# Patient Record
Sex: Male | Born: 1975 | Race: Black or African American | Hispanic: No | Smoking: Never smoker
Health system: Southern US, Community
[De-identification: ages and names within clinical notes are randomized; demographics above are authoritative.]

## PROBLEM LIST (undated history)

## (undated) DIAGNOSIS — J45909 Unspecified asthma, uncomplicated: Secondary | ICD-10-CM

## (undated) HISTORY — PX: ELBOW SURGERY: SHX618

---

## 2004-12-06 ENCOUNTER — Emergency Department: Payer: Self-pay | Admitting: Emergency Medicine

## 2007-01-21 ENCOUNTER — Emergency Department: Payer: Self-pay | Admitting: Unknown Physician Specialty

## 2007-11-15 ENCOUNTER — Emergency Department: Payer: Self-pay | Admitting: Emergency Medicine

## 2009-12-13 ENCOUNTER — Emergency Department: Payer: Self-pay | Admitting: Emergency Medicine

## 2012-06-03 ENCOUNTER — Emergency Department: Payer: Self-pay | Admitting: Emergency Medicine

## 2015-08-14 ENCOUNTER — Emergency Department
Admission: EM | Admit: 2015-08-14 | Discharge: 2015-08-14 | Disposition: A | Discharge status: Home / Self Care | Payer: 59 | Attending: Emergency Medicine | Admitting: Emergency Medicine

## 2015-08-14 ENCOUNTER — Encounter: Payer: Self-pay | Admitting: Emergency Medicine

## 2015-08-14 DIAGNOSIS — R300 Dysuria: Secondary | ICD-10-CM | POA: Diagnosis present

## 2015-08-14 LAB — URINALYSIS COMPLETE WITH MICROSCOPIC (ARMC ONLY)
BACTERIA UA: NONE SEEN
BILIRUBIN URINE: NEGATIVE
GLUCOSE, UA: NEGATIVE mg/dL
Hgb urine dipstick: NEGATIVE
KETONES UR: NEGATIVE mg/dL
LEUKOCYTES UA: NEGATIVE
NITRITE: NEGATIVE
Protein, ur: NEGATIVE mg/dL
SPECIFIC GRAVITY, URINE: 1.025 (ref 1.005–1.030)
Squamous Epithelial / LPF: NONE SEEN
pH: 5 (ref 5.0–8.0)

## 2015-08-14 LAB — CHLAMYDIA/NGC RT PCR (ARMC ONLY)
CHLAMYDIA TR: NOT DETECTED
N gonorrhoeae: NOT DETECTED

## 2015-08-14 MED ORDER — PHENAZOPYRIDINE HCL 200 MG PO TABS: 200.0000 mg | ORAL_TABLET | Freq: Three times a day (TID) | ORAL | Status: DC | PRN

## 2015-08-14 NOTE — ED Notes (Signed)
States he developed penile burning /itching since last week

## 2015-08-14 NOTE — ED Notes (Signed)
States he had unprotected sex last week  Then developed some itching   States the itching is constant

## 2015-08-14 NOTE — ED Provider Notes (Signed)
[facility] Emergency Department Provider Note   ____________________________________________  Time seen: Approximately 11:14 AM  I have reviewed the triage vital signs and the nursing notes.   HISTORY  Chief Complaint Exposure to STD    HPI Stephen Mercado is a 40 y.o. male male complaining of urethral burning 1 week. Further questions patient describes most itching sensation when he urinated. Any vaginal discharge. Patient does have multiple sexual partners and unprotected sexual intercourse. Patient denies any fever or chills with this complaint patient denies any penial lesions. Patient denies any swelling in the groin area. History reviewed. No pertinent past medical history.  There are no active problems to display for this patient.   History reviewed. No pertinent past surgical history.  Current Outpatient Rx  Name  Route  Sig  Dispense  Refill  . phenazopyridine (PYRIDIUM) 200 MG tablet   Oral   Take 1 tablet (200 mg total) by mouth 3 (three) times daily as needed for pain.   6 tablet   0     Allergies Review of patient's allergies indicates no known allergies.  No family history on file.  Social History Social History  Substance Use Topics  . Smoking status: Never Smoker   . Smokeless tobacco: None  . Alcohol Use: No    Review of Systems Constitutional: No fever/chills Eyes: No visual changes. ENT: No sore throat. Cardiovascular: Denies chest pain. Respiratory: Denies shortness of breath. Gastrointestinal: No abdominal pain.  No nausea, no vomiting.  No diarrhea.  No constipation. Genitourinary: Positive for dysuria. Musculoskeletal: Negative for back pain. Skin: Negative for rash. Neurological: Negative for headaches, focal weakness or numbness.    ____________________________________________   PHYSICAL EXAM:  VITAL SIGNS: ED Triage Vitals  Enc Vitals Group     BP --      Pulse --      Resp --      Temp --    Temp src --      SpO2 --      Weight --      Height --      Head Cir --      Peak Flow --      Pain Score --      Pain Loc --      Pain Edu? --      Excl. in [place]? --     Constitutional: Alert and oriented. Well appearing and in no acute distress. Eyes: Conjunctivae are normal. PERRL. EOMI. Head: Atraumatic. Nose: No congestion/rhinnorhea. Mouth/Throat: Mucous membranes are moist.  Oropharynx non-erythematous. Neck: No stridor. No cervical spine tenderness to palpation. Hematological/Lymphatic/Immunilogical: No cervical lymphadenopathy. Cardiovascular: Normal rate, regular rhythm. Grossly normal heart sounds.  Good peripheral circulation. Respiratory: Normal respiratory effort.  No retractions. Lungs CTAB. Gastrointestinal: Soft and nontender. No distention. No abdominal bruits. No CVA tenderness. Genitourinary: No lesions. Nodes express discharge. No adenopathy. **}Musculoskeletal: No lower extremity tenderness nor edema.  No joint effusions. Neurologic:  Normal speech and language. No gross focal neurologic deficits are appreciated. No gait instability. Skin:  Skin is warm, dry and intact. No rash noted. Psychiatric: Mood and affect are normal. Speech and behavior are normal.  ____________________________________________   LABS (all labs ordered are listed, but only abnormal results are displayed)  Labs Reviewed  URINALYSIS COMPLETEWITH MICROSCOPIC ([place] ONLY) - Abnormal; Notable for the following:    Color, Urine YELLOW (*)    APPearance CLEAR (*)    All other components within normal limits  CHLAMYDIA/NGC RT  PCR ([facility] ONLY)   ____________________________________________  EKG   ____________________________________________  RADIOLOGY   ____________________________________________   PROCEDURES  Procedure(s) performed: None  Critical Care performed: No  ____________________________________________   INITIAL IMPRESSION / ASSESSMENT AND PLAN / ED  COURSE  Pertinent labs & imaging results that were available during my care of the patient were reviewed by me and considered in my medical decision making (see chart for details).  Dysuria without objective findings. Patient advised Chlamydia and GC is pending. Patient reports  has to leave to pick up children. Patient advised he'll be contacted telephonically if Chlamydia and GC are positive. ____________________________________________   FINAL CLINICAL IMPRESSION(S) / ED DIAGNOSES  Final diagnoses:  Dysuria      NEW MEDICATIONS STARTED DURING THIS VISIT:  New Prescriptions   PHENAZOPYRIDINE (PYRIDIUM) 200 MG TABLET    Take 1 tablet (200 mg total) by mouth 3 (three) times daily as needed for pain.     Note:  This document was prepared using Dragon voice recognition software and may include unintentional dictation errors.     Sable Feil, PA-C 08/14/15 1307  Lisa Roca, MD 08/14/15 1350

## 2015-08-14 NOTE — Discharge Instructions (Signed)

## 2016-05-18 ENCOUNTER — Emergency Department
Admission: EM | Admit: 2016-05-18 | Discharge: 2016-05-18 | Disposition: A | Discharge status: Home / Self Care | Payer: 59 | Attending: Emergency Medicine | Admitting: Emergency Medicine

## 2016-05-18 ENCOUNTER — Encounter: Payer: Self-pay | Admitting: Medical Oncology

## 2016-05-18 DIAGNOSIS — J45909 Unspecified asthma, uncomplicated: Secondary | ICD-10-CM | POA: Diagnosis not present

## 2016-05-18 DIAGNOSIS — J111 Influenza due to unidentified influenza virus with other respiratory manifestations: Secondary | ICD-10-CM | POA: Insufficient documentation

## 2016-05-18 DIAGNOSIS — R509 Fever, unspecified: Secondary | ICD-10-CM | POA: Diagnosis present

## 2016-05-18 HISTORY — DX: Unspecified asthma, uncomplicated: J45.909

## 2016-05-18 MED ORDER — ACETAMINOPHEN 500 MG PO TABS
ORAL_TABLET | ORAL | Status: AC
  Filled 2016-05-18: qty 2

## 2016-05-18 MED ORDER — FLUTICASONE PROPIONATE 50 MCG/ACT NA SUSP: 2.0000 | Freq: Every day | NASAL | 0 refills | Status: DC

## 2016-05-18 MED ORDER — ACETAMINOPHEN 500 MG PO TABS
1000.0000 mg | ORAL_TABLET | Freq: Once | ORAL | Status: AC
  Administered 2016-05-18: 1000 mg via ORAL

## 2016-05-18 MED ORDER — BENZONATATE 100 MG PO CAPS: 100.0000 mg | ORAL_CAPSULE | Freq: Three times a day (TID) | ORAL | 0 refills | Status: DC | PRN

## 2016-05-18 NOTE — ED Notes (Signed)
Notified PA Berniece Salines about pt's temperature order to give Tylenol

## 2016-05-18 NOTE — ED Triage Notes (Signed)
Pt reports he woke up this am with chills, fever, and body aches.

## 2016-05-18 NOTE — ED Notes (Signed)
Pt reports generalized malaise for the past 2 days reports taking OTC meds to help with symptoms pt denies any n/v/d/ pt talks in complete sentences no respiratory distress noted

## 2016-05-18 NOTE — ED Notes (Signed)
Pt verbalizes understanding of discharge instructions.

## 2016-05-18 NOTE — ED Provider Notes (Signed)
[facility] Emergency Department Provider Note ____________________________________________  Time seen: 1813  I have reviewed the triage vital signs and the nursing notes.  HISTORY  Chief Complaint  Fever; Chills; and Generalized Body Aches  HPI Stephen Mercado is a 41 y.o. male presents to the ED for flu-like symptoms for the last 3 days. He reports subjective fevers, sinus pressure, and bodyaches. He did not receive the seasonal flu vaccine. He has dosed DayQuil and Alka-Seltzer tabs for symptom relief. He denies nausea, vomiting or diarrhea.   Past Medical History:  Diagnosis Date  . Asthma     There are no active problems to display for this patient.  History reviewed. No pertinent surgical history.  Prior to Admission medications   Medication Sig Start Date End Date Taking? Authorizing Provider  benzonatate (TESSALON PERLES) 100 MG capsule Take 1 capsule (100 mg total) by mouth 3 (three) times daily as needed for cough (Take 1-2 per dose). 05/18/16   Aletta Edmunds V Bacon Taquila Leys, PA-C  fluticasone (FLONASE) 50 MCG/ACT nasal spray Place 2 sprays into both nostrils daily. 05/18/16   Aragon Scarantino V Bacon Djibril Glogowski, PA-C  phenazopyridine (PYRIDIUM) 200 MG tablet Take 1 tablet (200 mg total) by mouth 3 (three) times daily as needed for pain. 08/14/15   Sable Feil, PA-C    Allergies Patient has no known allergies.  No family history on file.  Social History Social History  Substance Use Topics  . Smoking status: Never Smoker  . Smokeless tobacco: Not on file  . Alcohol use No    Review of Systems  Constitutional: Positive for fever. Eyes: Negative for visual changes. ENT: Negative for sore throat. Reports sinus congestions Cardiovascular: Negative for chest pain. Respiratory: Negative for shortness of breath. Gastrointestinal: Negative for abdominal pain, vomiting and diarrhea. Musculoskeletal: Negative for back pain. Reports generalized bodyaches Skin:  Negative for rash. Neurological: Negative for headaches, focal weakness or numbness. ____________________________________________  PHYSICAL EXAM:  VITAL SIGNS: ED Triage Vitals  Enc Vitals Group     BP 05/18/16 1608 (!) 156/82     Pulse Rate 05/18/16 1608 (!) 102     Resp 05/18/16 1608 18     Temp 05/18/16 1608 100 F (37.8 C)     Temp Source 05/18/16 1608 Oral     SpO2 05/18/16 1608 96 %     Weight 05/18/16 1607 152 lb (68.9 kg)     Height 05/18/16 1607 5\' 8"  (1.727 m)     Head Circumference --      Peak Flow --      Pain Score 05/18/16 1608 8     Pain Loc --      Pain Edu? --      Excl. in [place]? --     Constitutional: Alert and oriented. Well appearing and in no distress. Head: Normocephalic and atraumatic. Eyes: Conjunctivae are normal. PERRL. Normal extraocular movements Ears: Canals clear. TMs intact bilaterally. Nose: No congestion/rhinorrhea/epistaxis. Mouth/Throat: Mucous membranes are moist. Uvula is midline. Tonsils are flat.  Neck: Supple. No thyromegaly. Hematological/Lymphatic/Immunological: No cervical lymphadenopathy. Cardiovascular: Normal rate, regular rhythm. Normal distal pulses. Respiratory: Normal respiratory effort. No wheezes/rales/rhonchi. Gastrointestinal: Soft and nontender. No distention. Musculoskeletal: Nontender with normal range of motion in all extremities.  ____________________________________________  PROCEDURES  Tylenol 1000 mg PO ____________________________________________  INITIAL IMPRESSION / ASSESSMENT AND PLAN / ED COURSE  Patient with influenza-like illness. He will be discharged with prescription for [facility] and Flonase. He will dose OTC Delsym, Tylenol, and pseudoephedrine.  A work note is provided for 3 days.  ____________________________________________  FINAL CLINICAL IMPRESSION(S) / ED DIAGNOSES  Final diagnoses:  Influenza      Melvenia Needles, PA-C 05/18/16 1850    Hinda Kehr, MD 05/18/16  2036

## 2016-05-18 NOTE — Discharge Instructions (Signed)
Your symptoms are consistent with the flu (influenza). Continue to dose your OTC meds. Consider starting Delsym for cough, Ibuprofen for fevers, and pseudoephedrine for sinus pressure. Take the prescription meds as directed. Follow-up with your provider or [facility] for continued symptoms.

## 2016-06-02 ENCOUNTER — Encounter (HOSPITAL_COMMUNITY): Payer: Self-pay

## 2016-06-02 DIAGNOSIS — R360 Urethral discharge without blood: Secondary | ICD-10-CM | POA: Diagnosis present

## 2016-06-02 DIAGNOSIS — Z79899 Other long term (current) drug therapy: Secondary | ICD-10-CM | POA: Insufficient documentation

## 2016-06-02 DIAGNOSIS — N342 Other urethritis: Secondary | ICD-10-CM | POA: Diagnosis not present

## 2016-06-02 DIAGNOSIS — J45909 Unspecified asthma, uncomplicated: Secondary | ICD-10-CM | POA: Insufficient documentation

## 2016-06-02 NOTE — ED Triage Notes (Signed)
Pt complaining of painful urination and discharge x 2 weeks. Pt states believe is chlamydia. Pt states hx of same. Pt states recent new sexual partner.

## 2016-06-03 ENCOUNTER — Emergency Department (HOSPITAL_COMMUNITY)
Admission: EM | Admit: 2016-06-03 | Discharge: 2016-06-03 | Disposition: A | Discharge status: Home / Self Care | Payer: 59 | Attending: Emergency Medicine | Admitting: Emergency Medicine

## 2016-06-03 DIAGNOSIS — R369 Urethral discharge, unspecified: Secondary | ICD-10-CM

## 2016-06-03 DIAGNOSIS — N342 Other urethritis: Secondary | ICD-10-CM

## 2016-06-03 LAB — GC/CHLAMYDIA PROBE AMP (~~LOC~~) NOT AT ARMC
Chlamydia: POSITIVE — AB
Neisseria Gonorrhea: NEGATIVE

## 2016-06-03 MED ORDER — AZITHROMYCIN 250 MG PO TABS
1000.0000 mg | ORAL_TABLET | Freq: Once | ORAL | Status: AC
  Administered 2016-06-03: 1000 mg via ORAL
  Filled 2016-06-03: qty 4

## 2016-06-03 MED ORDER — LIDOCAINE HCL (PF) 1 % IJ SOLN
INTRAMUSCULAR | Status: AC
  Administered 2016-06-03: 5 mL
  Filled 2016-06-03: qty 5

## 2016-06-03 MED ORDER — CEFTRIAXONE SODIUM 1 G IJ SOLR
1.0000 g | Freq: Once | INTRAMUSCULAR | Status: AC
  Administered 2016-06-03: 1 g via INTRAMUSCULAR
  Filled 2016-06-03: qty 10

## 2016-06-03 NOTE — ED Provider Notes (Signed)
[facility] DEPT Provider Note   CSN: [id number] Arrival date & time: 06/02/16  2351     History   Chief Complaint Chief Complaint  Patient presents with  . Penile Discharge    HPI Kavonte Doney is a 41 y.o. male.  Patient presents emergency department with chief complaint of penile discharge. He reports onset yesterday. He endorses new sexual encounters. He reports some burning when he denies. He denies any fevers chills. Denies any testicle pain. Denies any other associated symptoms.   The history is provided by the patient. No language interpreter was used.    Past Medical History:  Diagnosis Date  . Asthma     There are no active problems to display for this patient.   History reviewed. No pertinent surgical history.     Home Medications    Prior to Admission medications   Medication Sig Start Date End Date Taking? Authorizing Provider  benzonatate (TESSALON PERLES) 100 MG capsule Take 1 capsule (100 mg total) by mouth 3 (three) times daily as needed for cough (Take 1-2 per dose). 05/18/16   Jenise V Bacon Menshew, PA-C  fluticasone (FLONASE) 50 MCG/ACT nasal spray Place 2 sprays into both nostrils daily. 05/18/16   Jenise V Bacon Menshew, PA-C  phenazopyridine (PYRIDIUM) 200 MG tablet Take 1 tablet (200 mg total) by mouth 3 (three) times daily as needed for pain. 08/14/15   Sable Feil, PA-C    Family History History reviewed. No pertinent family history.  Social History Social History  Substance Use Topics  . Smoking status: Never Smoker  . Smokeless tobacco: Never Used  . Alcohol use No     Allergies   Patient has no known allergies.   Review of Systems Review of Systems  Genitourinary: Positive for discharge and dysuria. Negative for testicular pain.     Physical Exam Updated Vital Signs BP 143/99 (BP Location: Left Arm)   Pulse 74   Temp 98.3 F (36.8 C) (Oral)   Resp 18   SpO2 98%   Physical Exam  Constitutional: He is oriented  to person, place, and time. No distress.  HENT:  Head: Normocephalic and atraumatic.  Eyes: Conjunctivae and EOM are normal. Pupils are equal, round, and reactive to light.  Neck: No tracheal deviation present.  Cardiovascular: Normal rate.   Pulmonary/Chest: Effort normal. No respiratory distress.  Abdominal: Soft.  Genitourinary:  Genitourinary Comments: Normal circumcised male, no masses or lesions, white milky discharge from penis  Musculoskeletal: Normal range of motion.  Neurological: He is alert and oriented to person, place, and time.  Skin: Skin is warm and dry. He is not diaphoretic.  Psychiatric: Judgment normal.  Nursing note and vitals reviewed.    ED Treatments / Results  Labs (all labs ordered are listed, but only abnormal results are displayed) Labs Reviewed  GC/CHLAMYDIA PROBE AMP ([place]) NOT AT [facility]    EKG  EKG Interpretation None       Radiology No results found.  Procedures Procedures (including critical care time)  Medications Ordered in ED Medications  cefTRIAXone (ROCEPHIN) injection 1 g (1 g Intramuscular Given 06/03/16 0053)  azithromycin (ZITHROMAX) tablet 1,000 mg (1,000 mg Oral Given 06/03/16 0053)  lidocaine (PF) (XYLOCAINE) 1 % injection (5 mLs  Given 06/03/16 0053)     Initial Impression / Assessment and Plan / ED Course  I have reviewed the triage vital signs and the nursing notes.  Pertinent labs & imaging results that were available during my care of  the patient were reviewed by me and considered in my medical decision making (see chart for details).     Patient with penile discharge. Will test for gonorrhea and chlamydia. Will treat with Rocephin and azithromycin. Primary care follow-up. Safe sex recommendations given.   Final Clinical Impressions(s) / ED Diagnoses   Final diagnoses:  Penile discharge  Urethritis    New Prescriptions New Prescriptions   No medications on file     Montine Circle, PA-C 06/03/16  [place], MD 06/03/16 [phone]

## 2016-07-17 ENCOUNTER — Emergency Department (HOSPITAL_COMMUNITY)
Admission: EM | Admit: 2016-07-17 | Discharge: 2016-07-17 | Disposition: A | Discharge status: Home / Self Care | Payer: 59 | Attending: Emergency Medicine | Admitting: Emergency Medicine

## 2016-07-17 DIAGNOSIS — J45909 Unspecified asthma, uncomplicated: Secondary | ICD-10-CM | POA: Insufficient documentation

## 2016-07-17 DIAGNOSIS — R369 Urethral discharge, unspecified: Secondary | ICD-10-CM | POA: Diagnosis present

## 2016-07-17 DIAGNOSIS — N3000 Acute cystitis without hematuria: Secondary | ICD-10-CM | POA: Insufficient documentation

## 2016-07-17 LAB — URINALYSIS, ROUTINE W REFLEX MICROSCOPIC
BILIRUBIN URINE: NEGATIVE
Glucose, UA: NEGATIVE mg/dL
Ketones, ur: NEGATIVE mg/dL
Nitrite: NEGATIVE
PROTEIN: NEGATIVE mg/dL
Specific Gravity, Urine: 1.028 (ref 1.005–1.030)
pH: 5 (ref 5.0–8.0)

## 2016-07-17 LAB — GC/CHLAMYDIA PROBE AMP (~~LOC~~) NOT AT ARMC
CHLAMYDIA, DNA PROBE: POSITIVE — AB
Neisseria Gonorrhea: NEGATIVE

## 2016-07-17 MED ORDER — STERILE WATER FOR INJECTION IJ SOLN
INTRAMUSCULAR | Status: AC
  Filled 2016-07-17: qty 10

## 2016-07-17 MED ORDER — AZITHROMYCIN 250 MG PO TABS
1000.0000 mg | ORAL_TABLET | Freq: Once | ORAL | Status: AC
  Administered 2016-07-17: 1000 mg via ORAL
  Filled 2016-07-17: qty 4

## 2016-07-17 MED ORDER — CEPHALEXIN 500 MG PO CAPS: 500.0000 mg | ORAL_CAPSULE | Freq: Two times a day (BID) | ORAL | 0 refills | Status: DC

## 2016-07-17 MED ORDER — CEFTRIAXONE SODIUM 250 MG IJ SOLR
250.0000 mg | Freq: Once | INTRAMUSCULAR | Status: AC
  Administered 2016-07-17: 250 mg via INTRAMUSCULAR
  Filled 2016-07-17: qty 250

## 2016-07-17 NOTE — ED Provider Notes (Signed)
TIME SEEN: 5:13 AM  CHIEF COMPLAINT: Penile discharge  HPI: Patient is a 41 year old male with previous history of Chlamydia who presents emergency department with 2 weeks of intermittent episodes of clear penile discharge. Was diagnosed with chlamydia or a 26 2018 and was given azithromycin 1000 mg. States his girlfriend was also treated and they both waited one week before any sexual activity again. He states that his penile discharge previously was yellow and resolved but now has come back and is clear and intermittent. No discharge currently. No dysuria, hematuria. No testicular pain or swelling. Denies fevers, chills, nausea, vomiting, diarrhea. No abdominal pain or flank pain. Did not have any blood work testing him for HIV, syphilis or hepatitis. He does have a primary care provider.  ROS: See HPI Constitutional: no fever  Eyes: no drainage  ENT: no runny nose   Cardiovascular:  no chest pain  Resp: no SOB  GI: no vomiting GU: no dysuria Integumentary: no rash  Allergy: no hives  Musculoskeletal: no leg swelling  Neurological: no slurred speech ROS otherwise negative  PAST MEDICAL HISTORY/PAST SURGICAL HISTORY:  Past Medical History:  Diagnosis Date  . Asthma     MEDICATIONS:  Prior to Admission medications   Medication Sig Start Date End Date Taking? Authorizing Provider  benzonatate (TESSALON PERLES) 100 MG capsule Take 1 capsule (100 mg total) by mouth 3 (three) times daily as needed for cough (Take 1-2 per dose). 05/18/16   Jenise V Bacon Menshew, PA-C  fluticasone (FLONASE) 50 MCG/ACT nasal spray Place 2 sprays into both nostrils daily. 05/18/16   Jenise V Bacon Menshew, PA-C  phenazopyridine (PYRIDIUM) 200 MG tablet Take 1 tablet (200 mg total) by mouth 3 (three) times daily as needed for pain. 08/14/15   Sable Feil, PA-C    ALLERGIES:  No Known Allergies  SOCIAL HISTORY:  Social History  Substance Use Topics  . Smoking status: Never Smoker  . Smokeless tobacco:  Never Used  . Alcohol use No    FAMILY HISTORY: No family history on file.  EXAM: BP 132/66 (BP Location: Left Arm)   Pulse 74   Temp 97.8 F (36.6 C) (Oral)   Resp 18   SpO2 100%  CONSTITUTIONAL: Alert and oriented and responds appropriately to questions. Well-appearing; well-nourished HEAD: Normocephalic EYES: Conjunctivae clear, pupils appear equal, EOMI ENT: normal nose; moist mucous membranes NECK: Supple, no meningismus, no nuchal rigidity, no LAD  CARD: RRR; S1 and S2 appreciated; no murmurs, no clicks, no rubs, no gallops RESP: Normal chest excursion without splinting or tachypnea; breath sounds clear and equal bilaterally; no wheezes, no rhonchi, no rales, no hypoxia or respiratory distress, speaking full sentences ABD/GI: Normal bowel sounds; non-distended; soft, non-tender, no rebound, no guarding, no peritoneal signs, no hepatosplenomegaly GU:  Declines  BACK:  The back appears normal and is non-tender to palpation, there is no CVA tenderness EXT: Normal ROM in all joints; non-tender to palpation; no edema; normal capillary refill; no cyanosis, no calf tenderness or swelling    SKIN: Normal color for age and race; warm; no rash NEURO: Moves all extremities equally PSYCH: The patient's mood and manner are appropriate. Grooming and personal hygiene are appropriate.  MEDICAL DECISION MAKING: Patient here with intermittent pain on discharge. Urine shows too numerous to count white blood cells but rare bacteria. We'll send urine culture. We'll also add on urine gonorrhea and chlamydia testing. Have offered HIV, syphilis and hepatitis testing today but patient declines stating he will follow-up  as an outpatient. Have advised his girlfriend who is also at bedside that she will need to be tested for STDs again as well. We'll give him a dose of ceftriaxone 250 mg IM and azithromycin 1000 mg by mouth here in the emergency department. We'll discharge with Keflex 500 mg twice a day for  the next week. Discussed return precautions. Patient and girlfriend are patent comfortable with this plan.  At this time, I do not feel there is any life-threatening condition present. I have reviewed and discussed all results (EKG, imaging, lab, urine as appropriate) and exam findings with patient/family. I have reviewed nursing notes and appropriate previous records.  I feel the patient is safe to be discharged home without further emergent workup and can continue workup as an outpatient as needed. Discussed usual and customary return precautions. Patient/family verbalize understanding and are comfortable with this plan.  Outpatient follow-up has been provided if needed. All questions have been answered.   I personally performed the services described in this documentation, which was scribed in my presence. The recorded information has been reviewed and is accurate.      [place], DO 07/17/16 [phone]

## 2016-07-17 NOTE — Discharge Instructions (Signed)
You have been treated again for gonorrhea and Chlamydia. I recommended you partner be tested again as well. I recommended she both avoid any sexual intercourse including vaginal, oral and anal sex for the next week after you have both received either negative result or have been treated for any positive results. I recommend outpatient follow-up for HIV, syphilis and hepatitis testing. Please take your antibiotics until complete. These antibiotics are covering you for possible urinary tract infection.

## 2016-07-17 NOTE — ED Triage Notes (Signed)
Pt states he was dx with STD over a week ago; pt states he continues to have clear to yellowish discharge; pt states he does not know if it is STD or UTI; pt denies pain; pt a&o x 4 on arrival; Pt denies urinary sx

## 2016-07-18 LAB — URINE CULTURE: Culture: NO GROWTH

## 2016-09-16 ENCOUNTER — Encounter: Payer: Self-pay | Admitting: Family Medicine

## 2016-09-16 ENCOUNTER — Ambulatory Visit (INDEPENDENT_AMBULATORY_CARE_PROVIDER_SITE_OTHER): Payer: 59 | Admitting: Family Medicine

## 2016-09-16 VITALS — BP 131/82 | HR 97 | Temp 98.4°F | Ht 68.2 in | Wt 164.3 lb

## 2016-09-16 DIAGNOSIS — Z0000 Encounter for general adult medical examination without abnormal findings: Secondary | ICD-10-CM | POA: Diagnosis not present

## 2016-09-16 DIAGNOSIS — J45909 Unspecified asthma, uncomplicated: Secondary | ICD-10-CM | POA: Insufficient documentation

## 2016-09-16 DIAGNOSIS — M654 Radial styloid tenosynovitis [de Quervain]: Secondary | ICD-10-CM | POA: Insufficient documentation

## 2016-09-16 DIAGNOSIS — Z113 Encounter for screening for infections with a predominantly sexual mode of transmission: Secondary | ICD-10-CM | POA: Diagnosis not present

## 2016-09-16 DIAGNOSIS — J4520 Mild intermittent asthma, uncomplicated: Secondary | ICD-10-CM

## 2016-09-16 LAB — UA/M W/RFLX CULTURE, ROUTINE
Bilirubin, UA: NEGATIVE
Glucose, UA: NEGATIVE
KETONES UA: NEGATIVE
LEUKOCYTES UA: NEGATIVE
Nitrite, UA: NEGATIVE
SPEC GRAV UA: 1.02 (ref 1.005–1.030)
Urobilinogen, Ur: 1 mg/dL (ref 0.2–1.0)
pH, UA: 7 (ref 5.0–7.5)

## 2016-09-16 LAB — MICROSCOPIC EXAMINATION: Bacteria, UA: NONE SEEN

## 2016-09-16 MED ORDER — ALBUTEROL SULFATE HFA 108 (90 BASE) MCG/ACT IN AERS: 2.0000 | INHALATION_SPRAY | Freq: Four times a day (QID) | RESPIRATORY_TRACT | 2 refills | Status: DC | PRN

## 2016-09-16 MED ORDER — NAPROXEN 500 MG PO TABS: 500.0000 mg | ORAL_TABLET | Freq: Two times a day (BID) | ORAL | 0 refills | Status: DC

## 2016-09-16 NOTE — Progress Notes (Signed)
BP 131/82 (BP Location: Left Arm, Patient Position: Sitting, Cuff Size: Large)   Pulse 97   Temp 98.4 F (36.9 C)   Ht 5' 8.2" (1.732 m)   Wt 164 lb 4.8 oz (74.5 kg)   SpO2 97%   BMI 24.84 kg/m    Subjective:    Patient ID: Stephen Mercado, male    DOB: 1975-06-11, 41 y.o.   MRN: [id number]  HPI: Stephen Mercado is a 41 y.o. male who presents today to establish care.   Chief Complaint  Patient presents with  . Establish Care  . Asthma  . Wrist Pain    Right, painful to open hand    HAND PAIN- lifted something up about 2 months ago and has been having pain in his R wrist since then. Duration: 2 months  Involved hand: right Mechanism of injury: picking something up Location: radial Onset: sudden Severity: moderate to severe Quality: tender and occasionally sharp Frequency: intermittent Radiation: yes- into his forearm Aggravating factors: opening his hand Alleviating factors: leaving it alone Treatments attempted: ice Relief with NSAIDs?: No NSAIDs Taken Weakness: no Numbness: no Redness: no Swelling:yes Bruising: no Fevers: no  ASTHMA- since childhood. Has been on inhalers in the past.  Asthma status: uncontrolled Satisfied with current treatment?: no Albuterol/rescue inhaler frequency: doesn't have one Dyspnea frequency: rarely Wheezing frequency: none Cough frequency: none Nocturnal symptom frequency: rarely  Limitation of activity: no Current upper respiratory symptoms: no Triggers: dogs, allergies Asthma meds in past: has never been on medicine every day Aerochamber/spacer use: no Visits to ER or Urgent Care in past year: no Pneumovax: Not up to Date Influenza: Not up to Date  Active Ambulatory Problems    Diagnosis Date Noted  . Asthma   . De Quervain's tenosynovitis, right 09/16/2016   Resolved Ambulatory Problems    Diagnosis Date Noted  . No Resolved Ambulatory Problems   Past Medical History:  Diagnosis Date  . Asthma    Past Surgical  History:  Procedure Laterality Date  . ELBOW SURGERY Left    Screws   Outpatient Encounter Prescriptions as of 09/16/2016  Medication Sig  . albuterol (PROVENTIL HFA;VENTOLIN HFA) 108 (90 Base) MCG/ACT inhaler Inhale 2 puffs into the lungs every 6 (six) hours as needed for wheezing or shortness of breath.  . naproxen (NAPROSYN) 500 MG tablet Take 1 tablet (500 mg total) by mouth 2 (two) times daily with a meal.  . [DISCONTINUED] benzonatate (TESSALON PERLES) 100 MG capsule Take 1 capsule (100 mg total) by mouth 3 (three) times daily as needed for cough (Take 1-2 per dose).  . [DISCONTINUED] cephALEXin (KEFLEX) 500 MG capsule Take 1 capsule (500 mg total) by mouth 2 (two) times daily.  . [DISCONTINUED] fluticasone (FLONASE) 50 MCG/ACT nasal spray Place 2 sprays into both nostrils daily.  . [DISCONTINUED] phenazopyridine (PYRIDIUM) 200 MG tablet Take 1 tablet (200 mg total) by mouth 3 (three) times daily as needed for pain.   No facility-administered encounter medications on file as of 09/16/2016.    No Known Allergies Family History  Problem Relation Age of Onset  . Hypertension Maternal Grandfather   . Asthma Daughter    Social History   Social History  . Marital status: Married    Spouse name: N/A  . Number of children: N/A  . Years of education: N/A   Occupational History  . Not on file.   Social History Main Topics  . Smoking status: Never Smoker  . Smokeless tobacco: Never Used  .  Alcohol use Yes     Comment: On occasion  . Drug use: No  . Sexual activity: Yes   Other Topics Concern  . Not on file   Social History Narrative  . No narrative on file     Review of Systems  Constitutional: Negative.   HENT: Negative.   Respiratory: Positive for cough, shortness of breath and wheezing. Negative for apnea, choking, chest tightness and stridor.   Cardiovascular: Negative.   Musculoskeletal: Positive for arthralgias. Negative for back pain, gait problem, joint  swelling, myalgias, neck pain and neck stiffness.  Psychiatric/Behavioral: Negative.     Per HPI unless specifically indicated above     Objective:    BP 131/82 (BP Location: Left Arm, Patient Position: Sitting, Cuff Size: Large)   Pulse 97   Temp 98.4 F (36.9 C)   Ht 5' 8.2" (1.732 m)   Wt 164 lb 4.8 oz (74.5 kg)   SpO2 97%   BMI 24.84 kg/m   Wt Readings from Last 3 Encounters:  09/16/16 164 lb 4.8 oz (74.5 kg)  05/18/16 152 lb (68.9 kg)  08/14/15 150 lb (68 kg)    Physical Exam  Constitutional: He is oriented to person, place, and time. He appears well-developed and well-nourished. No distress.  HENT:  Head: Normocephalic and atraumatic.  Right Ear: Hearing and external ear normal.  Left Ear: Hearing and external ear normal.  Nose: Nose normal.  Mouth/Throat: Oropharynx is clear and moist. No oropharyngeal exudate.  Eyes: Conjunctivae and lids are normal. Right eye exhibits no discharge. Left eye exhibits no discharge. No scleral icterus.  Cardiovascular: Normal rate, regular rhythm, normal heart sounds and intact distal pulses.  Exam reveals no gallop and no friction rub.   No murmur heard. Pulmonary/Chest: Effort normal and breath sounds normal. No respiratory distress. He has no wheezes. He has no rales. He exhibits no tenderness.  Musculoskeletal: He exhibits edema and tenderness.  Tenderness over tendon on the R radial wrist. + Finklesteins  Neurological: He is alert and oriented to person, place, and time.  Skin: Skin is warm, dry and intact. No rash noted. He is not diaphoretic. No erythema. No pallor.  Psychiatric: He has a normal mood and affect. His speech is normal and behavior is normal. Judgment and thought content normal. Cognition and memory are normal.  Nursing note and vitals reviewed.   Results for orders placed or performed during the hospital encounter of 07/17/16  Urine culture  Result Value Ref Range   Specimen Description URINE, RANDOM     Special Requests NONE    Culture NO GROWTH    Report Status 07/18/2016 FINAL   Urinalysis, Routine w reflex microscopic- may I&O cath if menses  Result Value Ref Range   Color, Urine YELLOW YELLOW   APPearance CLEAR CLEAR   Specific Gravity, Urine 1.028 1.005 - 1.030   pH 5.0 5.0 - 8.0   Glucose, UA NEGATIVE NEGATIVE mg/dL   Hgb urine dipstick SMALL (A) NEGATIVE   Bilirubin Urine NEGATIVE NEGATIVE   Ketones, ur NEGATIVE NEGATIVE mg/dL   Protein, ur NEGATIVE NEGATIVE mg/dL   Nitrite NEGATIVE NEGATIVE   Leukocytes, UA TRACE (A) NEGATIVE   RBC / HPF 0-5 0 - 5 RBC/hpf   WBC, UA TOO NUMEROUS TO COUNT 0 - 5 WBC/hpf   Bacteria, UA RARE (A) NONE SEEN   Squamous Epithelial / LPF 0-5 (A) NONE SEEN   Mucous PRESENT   GC/Chlamydia probe amp ([place])not at [facility]  Result  Value Ref Range   Chlamydia **POSITIVE** (A)    Neisseria gonorrhea Negative       Assessment & Plan:   Problem List Items Addressed This Visit      Respiratory   Asthma - Primary    Not under good control. Has not been on medication in years. Will start him on albuterol PRN and recheck with spiro in 2-4 weeks. Call with any concerns.       Relevant Medications   albuterol (PROVENTIL HFA;VENTOLIN HFA) 108 (90 Base) MCG/ACT inhaler     Musculoskeletal and Integument   De Quervain's tenosynovitis, right    Will start naproxen and exercises. Call with any concerns recheck 2-4 weeks.        Other Visit Diagnoses    Screening for STD (sexually transmitted disease)       Labs drawn today, await results.    Relevant Orders   GC/Chlamydia Probe Amp   Hepatitis C antibody   HSV(herpes simplex vrs) 1+2 ab-IgG   HIV antibody   RPR   Routine general medical examination at a health care facility       Not done today- to be done next visit. Labs drawn today to save him a stick.    Relevant Orders   CBC with Differential/Platelet   Comprehensive metabolic panel   Lipid Panel w/o Chol/HDL Ratio   TSH   UA/M w/rflx  Culture, Routine       Follow up plan: Return 2-4 weeks, for Physical/Spiro/Follow up wrist.

## 2016-09-16 NOTE — Patient Instructions (Addendum)
De Quervain Tenosynovitis  Tendons attach muscles to bones. They also help with joint movements. When tendons become irritated or swollen, it is called tendinitis.  The extensor pollicis brevis (EPB) tendon connects the EPB muscle to a bone that is near the base of the thumb. The EPB muscle helps to straighten and extend the thumb. De Quervain tenosynovitis is a condition in which the EPB tendon lining (sheath) becomes irritated, thickened, and swollen. This condition is sometimes called stenosing tenosynovitis. This condition causes pain on the thumb side of the back of the wrist.  What are the causes?  Causes of this condition include:   Activities that repeatedly cause your thumb and wrist to extend.   A sudden increase in activity or change in activity that affects your wrist.    What increases the risk?  This condition is more likely to develop in:   Females.   People who have diabetes.   Women who have recently given birth.   People who are over 40 years of age.   People who do activities that involve repeated hand and wrist motions, such as tennis, racquetball, volleyball, gardening, and taking care of children.   People who do heavy labor.   People who have poor wrist strength and flexibility.   People who do not warm up properly before activities.    What are the signs or symptoms?  Symptoms of this condition include:   Pain or tenderness over the thumb side of the back of the wrist when your thumb and wrist are not moving.   Pain that gets worse when you straighten your thumb or extend your thumb or wrist.   Pain when the injured area is touched.   Locking or catching of the thumb joint while you bend and straighten your thumb.   Decreased thumb motion due to pain.   Swelling over the affected area.    How is this diagnosed?  This condition is diagnosed with a medical history and physical exam. Your health care provider will ask for details about your injury and ask about your  symptoms.  How is this treated?  Treatment may include the use of icing and medicines to reduce pain and swelling. You may also be advised to wear a splint or brace to limit your thumb and wrist motion. In less severe cases, treatment may also include working with a physical therapist to strengthen your wrist and calm the irritation around your EPB tendon sheath. In severe cases, surgery may be needed.  Follow these instructions at home:  If you have a splint or brace:   Wear it as told by your health care provider. Remove it only as told by your health care provider.   Loosen the splint or brace if your fingers become numb and tingle, or if they turn cold and blue.   Keep the splint or brace clean and dry.  Managing pain, stiffness, and swelling   If directed, apply ice to the injured area.  ? Put ice in a plastic bag.  ? Place a towel between your skin and the bag.  ? Leave the ice on for 20 minutes, 2-3 times per day.   Move your fingers often to avoid stiffness and to lessen swelling.   Raise (elevate) the injured area above the level of your heart while you are sitting or lying down.  General instructions   Return to your normal activities as told by your health care provider. Ask your health care provider   what activities are safe for you.   Take over-the-counter and prescription medicines only as told by your health care provider.   Keep all follow-up visits as told by your health care provider. This is important.   Do not drive or operate heavy machinery while taking prescription pain medicine.  Contact a health care provider if:   Your pain, tenderness, or swelling gets worse, even if you have had treatment.   You have numbness or tingling in your wrist, hand, or fingers on the injured side.  This information is not intended to replace advice given to you by your health care provider. Make sure you discuss any questions you have with your health care provider.  Document Released: 03/25/2005  Document Revised: 08/31/2015 Document Reviewed: 05/31/2014  [employer]vier Interactive Patient Education  2018 [employer]

## 2016-09-16 NOTE — Assessment & Plan Note (Signed)
Will start naproxen and exercises. Call with any concerns recheck 2-4 weeks.

## 2016-09-16 NOTE — Assessment & Plan Note (Signed)
Not under good control. Has not been on medication in years. Will start him on albuterol PRN and recheck with spiro in 2-4 weeks. Call with any concerns.

## 2016-09-17 ENCOUNTER — Telehealth: Payer: Self-pay | Admitting: Family Medicine

## 2016-09-17 LAB — CBC WITH DIFFERENTIAL/PLATELET
BASOS ABS: 0 10*3/uL (ref 0.0–0.2)
Basos: 1 %
EOS (ABSOLUTE): 0.6 10*3/uL — ABNORMAL HIGH (ref 0.0–0.4)
Eos: 11 %
HEMOGLOBIN: 16 g/dL (ref 13.0–17.7)
Hematocrit: 48.1 % (ref 37.5–51.0)
Immature Grans (Abs): 0 10*3/uL (ref 0.0–0.1)
Immature Granulocytes: 0 %
LYMPHS ABS: 1.7 10*3/uL (ref 0.7–3.1)
Lymphs: 33 %
MCH: 27.7 pg (ref 26.6–33.0)
MCHC: 33.3 g/dL (ref 31.5–35.7)
MCV: 83 fL (ref 79–97)
MONOCYTES: 10 %
Monocytes Absolute: 0.5 10*3/uL (ref 0.1–0.9)
Neutrophils Absolute: 2.3 10*3/uL (ref 1.4–7.0)
Neutrophils: 45 %
Platelets: 196 10*3/uL (ref 150–379)
RBC: 5.78 x10E6/uL (ref 4.14–5.80)
RDW: 14 % (ref 12.3–15.4)
WBC: 5.2 10*3/uL (ref 3.4–10.8)

## 2016-09-17 LAB — LIPID PANEL W/O CHOL/HDL RATIO
CHOLESTEROL TOTAL: 157 mg/dL (ref 100–199)
HDL: 49 mg/dL (ref >39)
LDL CALC: 56 mg/dL (ref 0–99)
TRIGLYCERIDES: 262 mg/dL — AB (ref 0–149)
VLDL Cholesterol Cal: 52 mg/dL — ABNORMAL HIGH (ref 5–40)

## 2016-09-17 LAB — COMPREHENSIVE METABOLIC PANEL
ALBUMIN: 4.6 g/dL (ref 3.5–5.5)
ALK PHOS: 59 IU/L (ref 39–117)
ALT: 59 IU/L — AB (ref 0–44)
AST: 31 IU/L (ref 0–40)
Albumin/Globulin Ratio: 1.3 (ref 1.2–2.2)
BUN / CREAT RATIO: 12 (ref 9–20)
BUN: 14 mg/dL (ref 6–24)
Bilirubin Total: 0.5 mg/dL (ref 0.0–1.2)
CO2: 27 mmol/L (ref 20–29)
CREATININE: 1.13 mg/dL (ref 0.76–1.27)
Calcium: 9.6 mg/dL (ref 8.7–10.2)
Chloride: 101 mmol/L (ref 96–106)
GFR calc Af Amer: 92 mL/min/{1.73_m2} (ref >59)
GFR calc non Af Amer: 80 mL/min/{1.73_m2} (ref >59)
GLUCOSE: 77 mg/dL (ref 65–99)
Globulin, Total: 3.5 g/dL (ref 1.5–4.5)
Potassium: 4 mmol/L (ref 3.5–5.2)
Sodium: 143 mmol/L (ref 134–144)
Total Protein: 8.1 g/dL (ref 6.0–8.5)

## 2016-09-17 LAB — RPR: RPR: NONREACTIVE

## 2016-09-17 LAB — HSV(HERPES SIMPLEX VRS) I + II AB-IGG: HSV 2 Glycoprotein G Ab, IgG: <0.91 index (ref 0.00–0.90)

## 2016-09-17 LAB — HIV ANTIBODY (ROUTINE TESTING W REFLEX): HIV Screen 4th Generation wRfx: NONREACTIVE

## 2016-09-17 LAB — TSH: TSH: 1.91 u[IU]/mL (ref 0.450–4.500)

## 2016-09-17 LAB — HEPATITIS C ANTIBODY: Hep C Virus Ab: 0.1 s/co ratio (ref 0.0–0.9)

## 2016-09-17 NOTE — Telephone Encounter (Signed)
Patient notified

## 2016-09-17 NOTE — Telephone Encounter (Signed)
Please let him know that his labs came back normal so far and we should get his gonarrhea and chlamydia test back tomorrow.

## 2016-09-18 ENCOUNTER — Telehealth: Payer: Self-pay | Admitting: Family Medicine

## 2016-09-18 LAB — GC/CHLAMYDIA PROBE AMP
Chlamydia trachomatis, NAA: NEGATIVE
NEISSERIA GONORRHOEAE BY PCR: NEGATIVE

## 2016-09-18 NOTE — Telephone Encounter (Signed)
Please let him know that his GC/Chlymidia were negative. Thanks!

## 2016-09-18 NOTE — Telephone Encounter (Signed)
Patient notified

## 2016-09-30 ENCOUNTER — Ambulatory Visit (INDEPENDENT_AMBULATORY_CARE_PROVIDER_SITE_OTHER): Payer: 59 | Admitting: Family Medicine

## 2016-09-30 ENCOUNTER — Ambulatory Visit
Admission: RE | Admit: 2016-09-30 | Discharge: 2016-09-30 | Disposition: A | Discharge status: Home / Self Care | Payer: 59 | Source: Ambulatory Visit | Attending: Family Medicine | Admitting: Family Medicine

## 2016-09-30 ENCOUNTER — Encounter: Payer: Self-pay | Admitting: Family Medicine

## 2016-09-30 VITALS — BP 154/82 | HR 68 | Temp 97.7°F | Ht 68.4 in | Wt 165.2 lb

## 2016-09-30 DIAGNOSIS — R030 Elevated blood-pressure reading, without diagnosis of hypertension: Secondary | ICD-10-CM

## 2016-09-30 DIAGNOSIS — M654 Radial styloid tenosynovitis [de Quervain]: Secondary | ICD-10-CM

## 2016-09-30 DIAGNOSIS — J4520 Mild intermittent asthma, uncomplicated: Secondary | ICD-10-CM | POA: Diagnosis not present

## 2016-09-30 DIAGNOSIS — Z0000 Encounter for general adult medical examination without abnormal findings: Secondary | ICD-10-CM

## 2016-09-30 MED ORDER — FLUTICASONE-SALMETEROL 250-50 MCG/DOSE IN AEPB: 1.0000 | INHALATION_SPRAY | Freq: Two times a day (BID) | RESPIRATORY_TRACT | 3 refills | Status: DC

## 2016-09-30 NOTE — Patient Instructions (Addendum)

## 2016-09-30 NOTE — Assessment & Plan Note (Signed)
Patient states he's not getting any better. Will obtain x-ray. Referral to ortho made today. Call with any concerns.

## 2016-09-30 NOTE — Assessment & Plan Note (Signed)
Will start advair. Recheck 2-3 weeks. Call with any concerns.

## 2016-09-30 NOTE — Progress Notes (Signed)
BP (!) 154/82 (BP Location: Right Arm, Patient Position: Sitting, Cuff Size: Large)   Pulse 68   Temp 97.7 F (36.5 C)   Ht 5' 8.4" (1.737 m)   Wt 165 lb 4 oz (75 kg)   SpO2 98%   BMI 24.83 kg/m    Subjective:    Patient ID: Stephen Mercado, male    DOB: May 25, 1975, 41 y.o.   MRN: [id number]  HPI: Stephen Mercado is a 41 y.o. male presenting on 09/30/2016 for comprehensive medical examination. Current medical complaints include:  ASTHMA- started on rescue inhaler last visit, to have spirometry today Asthma status: uncontrolled Satisfied with current treatment?: no Albuterol/rescue inhaler frequency: 3x times Dyspnea frequency: rarely Wheezing frequency: yes if he waits too long Cough frequency: rarely Nocturnal symptom frequency: no  Limitation of activity: no Current upper respiratory symptoms: no Triggers: dogs, allergies Asthma meds in past: has never been on medicine every day Aerochamber/spacer use: no Visits to ER or Urgent Care in past year: no Pneumovax: Not up to Date Influenza: Not up to Date  WRIST PAIN- started on naproxen and exercises last visit- he notes that it mainly hurts when he wakes up, claims he is no better Duration: 2.5 months  Involved wrist: right Mechanism of injury: picking something up Location: radial Onset: sudden Severity: moderate to severe Quality: tender and occasionally sharp Frequency: intermittent Radiation: yes- into his forearm Aggravating factors: opening his hand Alleviating factors: leaving it alone Treatments attempted: ice Relief with NSAIDs?: No  Weakness: no Numbness: no Redness: no Swelling:yes Bruising: no Fevers: no  He currently lives with: girlfriend Interim Problems from his last visit: no  Depression Screen done today and results listed below:  Depression screen [employer] 2/9 09/30/2016  Decreased Interest 0  Down, Depressed, Hopeless 0  PHQ - 2 Score 0     Past Medical History:  Past Medical History:    Diagnosis Date  . Asthma     Surgical History:  Past Surgical History:  Procedure Laterality Date  . ELBOW SURGERY Left    Screws    Medications:  Current Outpatient Prescriptions on File Prior to Visit  Medication Sig  . albuterol (PROVENTIL HFA;VENTOLIN HFA) 108 (90 Base) MCG/ACT inhaler Inhale 2 puffs into the lungs every 6 (six) hours as needed for wheezing or shortness of breath.  . naproxen (NAPROSYN) 500 MG tablet Take 1 tablet (500 mg total) by mouth 2 (two) times daily with a meal.   No current facility-administered medications on file prior to visit.     Allergies:  No Known Allergies  Social History:  Social History   Social History  . Marital status: Divorced    Spouse name: N/A  . Number of children: N/A  . Years of education: N/A   Occupational History  . Not on file.   Social History Main Topics  . Smoking status: Never Smoker  . Smokeless tobacco: Never Used  . Alcohol use Yes     Comment: On occasion  . Drug use: No  . Sexual activity: Yes   Other Topics Concern  . Not on file   Social History Narrative  . No narrative on file   History  Smoking Status  . Never Smoker  Smokeless Tobacco  . Never Used   History  Alcohol Use  . Yes    Comment: On occasion    Family History:  Family History  Problem Relation Age of Onset  . Hypertension Maternal Grandfather   . Asthma  Daughter     Past medical history, surgical history, medications, allergies, family history and social history reviewed with patient today and changes made to appropriate areas of the chart.   Review of Systems  Constitutional: Negative.   HENT: Negative.   Eyes: Negative.   Respiratory: Negative.   Cardiovascular: Negative.   Gastrointestinal: Negative.   Genitourinary: Negative.   Musculoskeletal: Positive for joint pain. Negative for back pain, falls, myalgias and neck pain.  Skin: Negative.   Neurological: Negative.   Endo/Heme/Allergies: Negative.    Psychiatric/Behavioral: Negative.     All other ROS negative except what is listed above and in the HPI.      Objective:    BP (!) 154/82 (BP Location: Right Arm, Patient Position: Sitting, Cuff Size: Large)   Pulse 68   Temp 97.7 F (36.5 C)   Ht 5' 8.4" (1.737 m)   Wt 165 lb 4 oz (75 kg)   SpO2 98%   BMI 24.83 kg/m   Wt Readings from Last 3 Encounters:  09/30/16 165 lb 4 oz (75 kg)  09/16/16 164 lb 4.8 oz (74.5 kg)  05/18/16 152 lb (68.9 kg)    Physical Exam  Constitutional: He is oriented to person, place, and time. He appears well-developed and well-nourished. No distress.  HENT:  Head: Normocephalic and atraumatic.  Right Ear: Hearing, tympanic membrane, external ear and ear canal normal.  Left Ear: Hearing, tympanic membrane, external ear and ear canal normal.  Nose: Nose normal.  Mouth/Throat: Uvula is midline, oropharynx is clear and moist and mucous membranes are normal. No oropharyngeal exudate.  Eyes: Conjunctivae, EOM and lids are normal. Pupils are equal, round, and reactive to light. Right eye exhibits no discharge. Left eye exhibits no discharge. No scleral icterus.  Neck: Normal range of motion. Neck supple. No JVD present. No tracheal deviation present. No thyromegaly present.  Cardiovascular: Normal rate, regular rhythm, normal heart sounds and intact distal pulses.  Exam reveals no gallop and no friction rub.   No murmur heard. Pulmonary/Chest: Effort normal and breath sounds normal. No stridor. No respiratory distress. He has no wheezes. He has no rales. He exhibits no tenderness.  Abdominal: Soft. Bowel sounds are normal. He exhibits no distension and no mass. There is no tenderness. There is no rebound and no guarding. Hernia confirmed negative in the right inguinal area and confirmed negative in the left inguinal area.  Genitourinary: Testes normal and penis normal. Right testis shows no mass, no swelling and no tenderness. Right testis is descended.  Cremasteric reflex is not absent on the right side. Left testis shows no mass, no swelling and no tenderness. Left testis is descended. Cremasteric reflex is not absent on the left side. Circumcised. No phimosis, paraphimosis, hypospadias, penile erythema or penile tenderness. No discharge found.  Musculoskeletal: Normal range of motion. He exhibits no edema, tenderness or deformity.  Lymphadenopathy:    He has no cervical adenopathy.  Neurological: He is alert and oriented to person, place, and time. He has normal reflexes. He displays normal reflexes. No cranial nerve deficit. He exhibits normal muscle tone. Coordination normal.  Skin: Skin is warm, dry and intact. No rash noted. He is not diaphoretic. No erythema. No pallor.  Psychiatric: He has a normal mood and affect. His speech is normal and behavior is normal. Judgment and thought content normal. Cognition and memory are normal.  Nursing note and vitals reviewed.   Results for orders placed or performed in visit on 09/16/16  GC/Chlamydia  Probe Amp  Result Value Ref Range   Chlamydia trachomatis, NAA Negative Negative   Neisseria gonorrhoeae by PCR Negative Negative  Microscopic Examination  Result Value Ref Range   WBC, UA 0-5 0 - 5 /hpf   RBC, UA 0-2 0 - 2 /hpf   Epithelial Cells (non renal) 0-10 0 - 10 /hpf   Bacteria, UA None seen None seen/Few  Hepatitis C antibody  Result Value Ref Range   Hep C Virus Ab 0.1 0.0 - 0.9 s/co ratio  HSV(herpes simplex vrs) 1+2 ab-IgG  Result Value Ref Range   HSV 1 Glycoprotein G Ab, IgG <0.91 0.00 - 0.90 index   HSV 2 Glycoprotein G Ab, IgG <0.91 0.00 - 0.90 index  HIV antibody  Result Value Ref Range   HIV Screen 4th Generation wRfx Non Reactive Non Reactive  RPR  Result Value Ref Range   RPR Ser Ql Non Reactive Non Reactive  CBC with Differential/Platelet  Result Value Ref Range   WBC 5.2 3.4 - 10.8 x10E3/uL   RBC 5.78 4.14 - 5.80 x10E6/uL   Hemoglobin 16.0 13.0 - 17.7 g/dL    Hematocrit 48.1 37.5 - 51.0 %   MCV 83 79 - 97 fL   MCH 27.7 26.6 - 33.0 pg   MCHC 33.3 31.5 - 35.7 g/dL   RDW 14.0 12.3 - 15.4 %   Platelets 196 150 - 379 x10E3/uL   Neutrophils 45 Not Estab. %   Lymphs 33 Not Estab. %   Monocytes 10 Not Estab. %   Eos 11 Not Estab. %   Basos 1 Not Estab. %   Neutrophils Absolute 2.3 1.4 - 7.0 x10E3/uL   Lymphocytes Absolute 1.7 0.7 - 3.1 x10E3/uL   Monocytes Absolute 0.5 0.1 - 0.9 x10E3/uL   EOS (ABSOLUTE) 0.6 (H) 0.0 - 0.4 x10E3/uL   Basophils Absolute 0.0 0.0 - 0.2 x10E3/uL   Immature Granulocytes 0 Not Estab. %   Immature Grans (Abs) 0.0 0.0 - 0.1 x10E3/uL  Comprehensive metabolic panel  Result Value Ref Range   Glucose 77 65 - 99 mg/dL   BUN 14 6 - 24 mg/dL   Creatinine, Ser 1.13 0.76 - 1.27 mg/dL   GFR calc non Af Amer 80 >59 mL/min/1.73   GFR calc Af Amer 92 >59 mL/min/1.73   BUN/Creatinine Ratio 12 9 - 20   Sodium 143 134 - 144 mmol/L   Potassium 4.0 3.5 - 5.2 mmol/L   Chloride 101 96 - 106 mmol/L   CO2 27 20 - 29 mmol/L   Calcium 9.6 8.7 - 10.2 mg/dL   Total Protein 8.1 6.0 - 8.5 g/dL   Albumin 4.6 3.5 - 5.5 g/dL   Globulin, Total 3.5 1.5 - 4.5 g/dL   Albumin/Globulin Ratio 1.3 1.2 - 2.2   Bilirubin Total 0.5 0.0 - 1.2 mg/dL   Alkaline Phosphatase 59 39 - 117 IU/L   AST 31 0 - 40 IU/L   ALT 59 (H) 0 - 44 IU/L  Lipid Panel w/o Chol/HDL Ratio  Result Value Ref Range   Cholesterol, Total 157 100 - 199 mg/dL   Triglycerides 262 (H) 0 - 149 mg/dL   HDL 49 >39 mg/dL   VLDL Cholesterol Cal 52 (H) 5 - 40 mg/dL   LDL Calculated 56 0 - 99 mg/dL  TSH  Result Value Ref Range   TSH 1.910 0.450 - 4.500 uIU/mL  UA/M w/rflx Culture, Routine  Result Value Ref Range   Specific Gravity, UA 1.020 1.005 - 1.030  pH, UA 7.0 5.0 - 7.5   Color, UA Yellow Yellow   Appearance Ur Clear Clear   Leukocytes, UA Negative Negative   Protein, UA Trace (A) Negative/Trace   Glucose, UA Negative Negative   Ketones, UA Negative Negative   RBC, UA  Trace (A) Negative   Bilirubin, UA Negative Negative   Urobilinogen, Ur 1.0 0.2 - 1.0 mg/dL   Nitrite, UA Negative Negative   Microscopic Examination See below:       Assessment & Plan:   Problem List Items Addressed This Visit      Respiratory   Asthma    Will start advair. Recheck 2-3 weeks. Call with any concerns.       Relevant Medications   Fluticasone-Salmeterol (ADVAIR DISKUS) 250-50 MCG/DOSE AEPB   Other Relevant Orders   Spirometry with Graph (Completed)     Musculoskeletal and Integument   De Quervain's tenosynovitis, right    Patient states he's not getting any better. Will obtain x-ray. Referral to ortho made today. Call with any concerns.       Relevant Orders   DG Wrist Complete Right   Ambulatory referral to Orthopedic Surgery    Other Visit Diagnoses    Routine general medical examination at a health care facility    -  Primary   Vaccines declined. Screening labs checked last visit. Continue diet and exercise. Call with any concerns.    Elevated BP without diagnosis of hypertension       Will recheck on follow up. Work on [place] maintenance labs ordered last visit as above.    IMMUNIZATIONS:   - Tdap: Tetanus vaccination status reviewed: declined - Influenza: Postponed to flu season - Pneumovax: Refused  PATIENT COUNSELING:    Sexuality: Discussed sexually transmitted diseases, partner selection, use of condoms, avoidance of unintended pregnancy  and contraceptive alternatives.   Advised to avoid cigarette smoking.  I discussed with the patient that most people either abstain from alcohol or drink within safe limits (<=14/week and <=4 drinks/occasion for males, <=7/weeks and <= 3 drinks/occasion for females) and that the risk for alcohol disorders and other health effects rises proportionally with the number of drinks per week and how often a drinker exceeds daily limits.  Discussed cessation/primary prevention  of drug use and availability of treatment for abuse.   Diet: Encouraged to adjust caloric intake to maintain  or achieve ideal body weight, to reduce intake of dietary saturated fat and total fat, to limit sodium intake by avoiding high sodium foods and not adding table salt, and to maintain adequate dietary potassium and calcium preferably from fresh fruits, vegetables, and low-fat dairy products.    stressed the importance of regular exercise  Injury prevention: Discussed safety belts, safety helmets, smoke detector, smoking near bedding or upholstery.   Dental health: Discussed importance of regular tooth brushing, flossing, and dental visits.   Follow up plan: NEXT PREVENTATIVE PHYSICAL DUE IN 1 YEAR. Return 2-3 weeks, for follow up breathing.

## 2016-10-01 ENCOUNTER — Telehealth: Payer: Self-pay | Admitting: Family Medicine

## 2016-10-01 NOTE — Telephone Encounter (Signed)
Please let him know that his x-ray came back normal and that he should keep doing his stretches and see the orthopedist. Thanks!

## 2016-10-01 NOTE — Telephone Encounter (Signed)
Left message letting the patient know the xray results.

## 2016-10-07 ENCOUNTER — Encounter (INDEPENDENT_AMBULATORY_CARE_PROVIDER_SITE_OTHER): Payer: Self-pay | Admitting: Orthopaedic Surgery

## 2016-10-07 ENCOUNTER — Ambulatory Visit (INDEPENDENT_AMBULATORY_CARE_PROVIDER_SITE_OTHER): Payer: BLUE CROSS/BLUE SHIELD | Admitting: Orthopaedic Surgery

## 2016-10-07 DIAGNOSIS — M654 Radial styloid tenosynovitis [de Quervain]: Secondary | ICD-10-CM

## 2016-10-07 NOTE — Progress Notes (Signed)
   Office Visit Note   Patient: Stephen Mercado           Date of Birth: Jan 04, 1976           MRN: [id number] Visit Date: 10/07/2016              Requested by: Valerie Roys, DO [place], [place] [address] PCP: Valerie Roys, DO   Assessment & Plan: Visit Diagnoses:  1. De Quervain's tenosynovitis, right     Plan: Overall impression is right de Quervain's tenosynovitis. I offered the patient injection but he declined. I did give him a thumb spica brace for now. I also offered prednisone which she declined. I'll see him back as needed.  Follow-Up Instructions: Return if symptoms worsen or fail to improve.   Orders:  No orders of the defined types were placed in this encounter.  No orders of the defined types were placed in this encounter.     Procedures: No procedures performed   Clinical Data: No additional findings.   Subjective: Chief Complaint  Patient presents with  . Right Wrist - Pain    Stephen Mercado is a 41 year old gentleman comes in with 3 month history of right radial sided wrist pain. 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 This is been going on for quite some time. He is not really rested it. He has occasional pain that radiates to the elbow. He denies any numbness or tingling. Denies any injuries.    Review of Systems  Constitutional: Negative.   All other systems reviewed and are negative.    Objective: Vital Signs: There were no vitals taken for this visit.  Physical Exam  Constitutional: He is oriented to person, place, and time. He appears well-developed and well-nourished.  HENT:  Head: Normocephalic and atraumatic.  Eyes: Pupils are equal, round, and reactive to light.  Neck: Neck supple.  Pulmonary/Chest: Effort normal.  Abdominal: Soft.  Musculoskeletal: Normal range of motion.  Neurological: He is alert and oriented to person, place, and time.  Skin: Skin is warm.  Psychiatric: He has a normal mood  and affect. His behavior is normal. Judgment and thought content normal.  Nursing note and vitals reviewed.   Ortho Exam Right wrist exam shows positive Finkelstein's test. Negative triggering. Negative grind test. Good grip strength. Radial styloid is tender to palpation. This is slightly swollen. Specialty Comments:  No specialty comments available.  Imaging: No results found.   PMFS History: Patient Active Problem List   Diagnosis Date Noted  . De Quervain's tenosynovitis, right 09/16/2016  . Asthma    Past Medical History:  Diagnosis Date  . Asthma     Family History  Problem Relation Age of Onset  . Hypertension Maternal Grandfather   . Asthma Daughter     Past Surgical History:  Procedure Laterality Date  . ELBOW SURGERY Left    Screws   Social History   Occupational History  . Not on file.   Social History Main Topics  . Smoking status: Never Smoker  . Smokeless tobacco: Never Used  . Alcohol use Yes     Comment: On occasion  . Drug use: No  . Sexual activity: Yes

## 2016-10-16 ENCOUNTER — Other Ambulatory Visit: Payer: Self-pay | Admitting: Family Medicine

## 2016-10-17 NOTE — Progress Notes (Signed)
BP 138/82 (BP Location: Left Arm, Patient Position: Sitting, Cuff Size: Large)   Pulse 72   Temp 98.4 F (36.9 C)   Wt 169 lb 4 oz (76.8 kg)   SpO2 97%   BMI 25.43 kg/m    Subjective:    Patient ID: Stephen Mercado, male    DOB: 06-07-1975, 41 y.o.   MRN: [id number]  HPI: Stephen Mercado is a 41 y.o. male  Chief Complaint  Patient presents with  . Asthma   SOB- only doing the advair 1x a day Asthma status: better Satisfied with current treatment?: yes Albuterol/rescue inhaler frequency: only 1x in the past month Dyspnea frequency: never Wheezing frequency: never Cough frequency: occasionally Nocturnal symptom frequency: 1x in the past month Limitation of activity: no Current upper respiratory symptoms: yes Triggers: URI Last Spirometry: today Failed/intolerant to following asthma meds: None Asthma meds in past: advair Aerochamber/spacer use: no Visits to ER or Urgent Care in past year: no Pneumovax: refused Influenza: refused  Relevant past medical, surgical, family and social history reviewed and updated as indicated. Interim medical history since our last visit reviewed. Allergies and medications reviewed and updated.  Review of Systems  Constitutional: Negative.   Respiratory: Negative.   Cardiovascular: Negative.   Musculoskeletal: Positive for arthralgias. Negative for back pain, gait problem, joint swelling, myalgias, neck pain and neck stiffness.  Psychiatric/Behavioral: Negative.     Per HPI unless specifically indicated above     Objective:    BP 138/82 (BP Location: Left Arm, Patient Position: Sitting, Cuff Size: Large)   Pulse 72   Temp 98.4 F (36.9 C)   Wt 169 lb 4 oz (76.8 kg)   SpO2 97%   BMI 25.43 kg/m   Wt Readings from Last 3 Encounters:  10/21/16 169 lb 4 oz (76.8 kg)  09/30/16 165 lb 4 oz (75 kg)  09/16/16 164 lb 4.8 oz (74.5 kg)    Physical Exam  Constitutional: He is oriented to person, place, and time. He appears well-developed and  well-nourished. No distress.  HENT:  Head: Normocephalic and atraumatic.  Right Ear: Hearing and external ear normal.  Left Ear: Hearing and external ear normal.  Nose: Nose normal.  Mouth/Throat: Oropharynx is clear and moist. No oropharyngeal exudate.  Eyes: Pupils are equal, round, and reactive to light. Conjunctivae, EOM and lids are normal. Right eye exhibits no discharge. Left eye exhibits no discharge. No scleral icterus.  Neck: Normal range of motion. Neck supple. No JVD present. No tracheal deviation present. No thyromegaly present.  Cardiovascular: Normal rate, regular rhythm, normal heart sounds and intact distal pulses.  Exam reveals no gallop and no friction rub.   No murmur heard. Pulmonary/Chest: Effort normal and breath sounds normal. No stridor. No respiratory distress. He has no wheezes. He has no rales. He exhibits no tenderness.  Musculoskeletal: Normal range of motion.  Lymphadenopathy:    He has no cervical adenopathy.  Neurological: He is alert and oriented to person, place, and time.  Skin: Skin is warm, dry and intact. No rash noted. He is not diaphoretic. No erythema. No pallor.  Psychiatric: He has a normal mood and affect. His speech is normal and behavior is normal. Judgment and thought content normal. Cognition and memory are normal.  Nursing note and vitals reviewed.   Results for orders placed or performed in visit on 09/16/16  GC/Chlamydia Probe Amp  Result Value Ref Range   Chlamydia trachomatis, NAA Negative Negative   Neisseria gonorrhoeae by PCR Negative  Negative  Microscopic Examination  Result Value Ref Range   WBC, UA 0-5 0 - 5 /hpf   RBC, UA 0-2 0 - 2 /hpf   Epithelial Cells (non renal) 0-10 0 - 10 /hpf   Bacteria, UA None seen None seen/Few  Hepatitis C antibody  Result Value Ref Range   Hep C Virus Ab 0.1 0.0 - 0.9 s/co ratio  HSV(herpes simplex vrs) 1+2 ab-IgG  Result Value Ref Range   HSV 1 Glycoprotein G Ab, IgG <0.91 0.00 - 0.90  index   HSV 2 Glycoprotein G Ab, IgG <0.91 0.00 - 0.90 index  HIV antibody  Result Value Ref Range   HIV Screen 4th Generation wRfx Non Reactive Non Reactive  RPR  Result Value Ref Range   RPR Ser Ql Non Reactive Non Reactive  CBC with Differential/Platelet  Result Value Ref Range   WBC 5.2 3.4 - 10.8 x10E3/uL   RBC 5.78 4.14 - 5.80 x10E6/uL   Hemoglobin 16.0 13.0 - 17.7 g/dL   Hematocrit 48.1 37.5 - 51.0 %   MCV 83 79 - 97 fL   MCH 27.7 26.6 - 33.0 pg   MCHC 33.3 31.5 - 35.7 g/dL   RDW 14.0 12.3 - 15.4 %   Platelets 196 150 - 379 x10E3/uL   Neutrophils 45 Not Estab. %   Lymphs 33 Not Estab. %   Monocytes 10 Not Estab. %   Eos 11 Not Estab. %   Basos 1 Not Estab. %   Neutrophils Absolute 2.3 1.4 - 7.0 x10E3/uL   Lymphocytes Absolute 1.7 0.7 - 3.1 x10E3/uL   Monocytes Absolute 0.5 0.1 - 0.9 x10E3/uL   EOS (ABSOLUTE) 0.6 (H) 0.0 - 0.4 x10E3/uL   Basophils Absolute 0.0 0.0 - 0.2 x10E3/uL   Immature Granulocytes 0 Not Estab. %   Immature Grans (Abs) 0.0 0.0 - 0.1 x10E3/uL  Comprehensive metabolic panel  Result Value Ref Range   Glucose 77 65 - 99 mg/dL   BUN 14 6 - 24 mg/dL   Creatinine, Ser 1.13 0.76 - 1.27 mg/dL   GFR calc non Af Amer 80 >59 mL/min/1.73   GFR calc Af Amer 92 >59 mL/min/1.73   BUN/Creatinine Ratio 12 9 - 20   Sodium 143 134 - 144 mmol/L   Potassium 4.0 3.5 - 5.2 mmol/L   Chloride 101 96 - 106 mmol/L   CO2 27 20 - 29 mmol/L   Calcium 9.6 8.7 - 10.2 mg/dL   Total Protein 8.1 6.0 - 8.5 g/dL   Albumin 4.6 3.5 - 5.5 g/dL   Globulin, Total 3.5 1.5 - 4.5 g/dL   Albumin/Globulin Ratio 1.3 1.2 - 2.2   Bilirubin Total 0.5 0.0 - 1.2 mg/dL   Alkaline Phosphatase 59 39 - 117 IU/L   AST 31 0 - 40 IU/L   ALT 59 (H) 0 - 44 IU/L  Lipid Panel w/o Chol/HDL Ratio  Result Value Ref Range   Cholesterol, Total 157 100 - 199 mg/dL   Triglycerides 262 (H) 0 - 149 mg/dL   HDL 49 >39 mg/dL   VLDL Cholesterol Cal 52 (H) 5 - 40 mg/dL   LDL Calculated 56 0 - 99 mg/dL  TSH    Result Value Ref Range   TSH 1.910 0.450 - 4.500 uIU/mL  UA/M w/rflx Culture, Routine  Result Value Ref Range   Specific Gravity, UA 1.020 1.005 - 1.030   pH, UA 7.0 5.0 - 7.5   Color, UA Yellow Yellow   Appearance Ur Clear  Clear   Leukocytes, UA Negative Negative   Protein, UA Trace (A) Negative/Trace   Glucose, UA Negative Negative   Ketones, UA Negative Negative   RBC, UA Trace (A) Negative   Bilirubin, UA Negative Negative   Urobilinogen, Ur 1.0 0.2 - 1.0 mg/dL   Nitrite, UA Negative Negative   Microscopic Examination See below:       Assessment & Plan:   Problem List Items Addressed This Visit      Respiratory   Asthma - Primary    Still not under great control, possibly due to mild URI. Will make sure that he takes his advair BID and we will recheck with spiro in 1 month. May need spiriva      Relevant Orders   Spirometry with Graph (Completed)       Follow up plan: Return in about 4 weeks (around 11/18/2016) for follow up breathing with spiro.

## 2016-10-21 ENCOUNTER — Ambulatory Visit (INDEPENDENT_AMBULATORY_CARE_PROVIDER_SITE_OTHER): Payer: BLUE CROSS/BLUE SHIELD | Admitting: Family Medicine

## 2016-10-21 ENCOUNTER — Encounter: Payer: Self-pay | Admitting: Family Medicine

## 2016-10-21 VITALS — BP 138/82 | HR 72 | Temp 98.4°F | Wt 169.2 lb

## 2016-10-21 DIAGNOSIS — J4520 Mild intermittent asthma, uncomplicated: Secondary | ICD-10-CM

## 2016-10-21 NOTE — Assessment & Plan Note (Addendum)
Still not under great control, possibly due to mild URI. Will make sure that he takes his advair BID and we will recheck with spiro in 1 month. May need spiriva

## 2016-12-02 ENCOUNTER — Encounter: Payer: Self-pay | Admitting: Family Medicine

## 2016-12-02 ENCOUNTER — Ambulatory Visit (INDEPENDENT_AMBULATORY_CARE_PROVIDER_SITE_OTHER): Payer: BLUE CROSS/BLUE SHIELD | Admitting: Family Medicine

## 2016-12-02 VITALS — BP 134/89 | HR 81 | Temp 97.8°F | Wt 172.0 lb

## 2016-12-02 DIAGNOSIS — J4520 Mild intermittent asthma, uncomplicated: Secondary | ICD-10-CM

## 2016-12-02 MED ORDER — MONTELUKAST SODIUM 10 MG PO TABS: 10.0000 mg | ORAL_TABLET | Freq: Every day | ORAL | 3 refills | Status: DC

## 2016-12-02 MED ORDER — FLUTICASONE-SALMETEROL 250-50 MCG/DOSE IN AEPB: 1.0000 | INHALATION_SPRAY | Freq: Two times a day (BID) | RESPIRATORY_TRACT | 3 refills | Status: DC

## 2016-12-02 NOTE — Assessment & Plan Note (Signed)
Still only taking advair daily- advised BID. Will start singulair to help with congestion and allergies. Recheck 1-2 months.

## 2016-12-02 NOTE — Progress Notes (Signed)
BP 134/89 (BP Location: Left Arm, Patient Position: Sitting, Cuff Size: Normal)   Pulse 81   Temp 97.8 F (36.6 C)   Wt 172 lb (78 kg)   SpO2 98%   BMI 25.85 kg/m    Subjective:    Patient ID: Stephen Mercado, male    DOB: [birth year], 41 y.o.   MRN: [id number]  HPI: Stephen Mercado is a 41 y.o. male  Chief Complaint  Patient presents with  . Asthma    Patient states that he is only taking Advair once a day due to a rash appearing on his body when taking it twice a day.   . Nasal Congestion   ASTHMA Asthma status: better Satisfied with current treatment?: no Albuterol/rescue inhaler frequency: rarely Dyspnea frequency:  rarely Wheezing frequency: never Cough frequency: rarely Nocturnal symptom frequency: never Limitation of activity: no Current upper respiratory symptoms: yes- congestion- better with benadryl Last Spirometry: Today Failed/intolerant to following asthma meds: none Asthma meds in past: advair Aerochamber/spacer use: no Visits to ER or Urgent Care in past year: no  Relevant past medical, surgical, family and social history reviewed and updated as indicated. Interim medical history since our last visit reviewed. Allergies and medications reviewed and updated.  Review of Systems  Constitutional: Negative.   HENT: Positive for congestion and postnasal drip. Negative for dental problem, drooling, ear discharge, ear pain, facial swelling, hearing loss, mouth sores, nosebleeds, rhinorrhea, sinus pain, sinus pressure, sneezing, sore throat, tinnitus, trouble swallowing and voice change.   Respiratory: Negative.   Cardiovascular: Negative.   Psychiatric/Behavioral: Negative.     Per HPI unless specifically indicated above     Objective:    BP 134/89 (BP Location: Left Arm, Patient Position: Sitting, Cuff Size: Normal)   Pulse 81   Temp 97.8 F (36.6 C)   Wt 172 lb (78 kg)   SpO2 98%   BMI 25.85 kg/m   Wt Readings from Last 3 Encounters:  12/02/16 172 lb  (78 kg)  10/21/16 169 lb 4 oz (76.8 kg)  09/30/16 165 lb 4 oz (75 kg)    Physical Exam  Constitutional: He is oriented to person, place, and time. He appears well-developed and well-nourished. No distress.  HENT:  Head: Normocephalic and atraumatic.  Right Ear: Hearing normal.  Left Ear: Hearing normal.  Nose: Nose normal.  Eyes: Conjunctivae and lids are normal. Right eye exhibits no discharge. Left eye exhibits no discharge. No scleral icterus.  Cardiovascular: Normal rate, regular rhythm, normal heart sounds and intact distal pulses.  Exam reveals no gallop and no friction rub.   No murmur heard. Pulmonary/Chest: Effort normal and breath sounds normal. No respiratory distress. He has no wheezes. He has no rales. He exhibits no tenderness.  Musculoskeletal: Normal range of motion.  Neurological: He is alert and oriented to person, place, and time.  Skin: Skin is warm, dry and intact. No rash noted. He is not diaphoretic. No erythema. No pallor.  Psychiatric: He has a normal mood and affect. His speech is normal and behavior is normal. Judgment and thought content normal. Cognition and memory are normal.  Nursing note and vitals reviewed.   Results for orders placed or performed in visit on 09/16/16  GC/Chlamydia Probe Amp  Result Value Ref Range   Chlamydia trachomatis, NAA Negative Negative   Neisseria gonorrhoeae by PCR Negative Negative  Microscopic Examination  Result Value Ref Range   WBC, UA 0-5 0 - 5 /hpf   RBC, UA 0-2 0 -  2 /hpf   Epithelial Cells (non renal) 0-10 0 - 10 /hpf   Bacteria, UA None seen None seen/Few  Hepatitis C antibody  Result Value Ref Range   Hep C Virus Ab 0.1 0.0 - 0.9 s/co ratio  HSV(herpes simplex vrs) 1+2 ab-IgG  Result Value Ref Range   HSV 1 Glycoprotein G Ab, IgG <0.91 0.00 - 0.90 index   HSV 2 Glycoprotein G Ab, IgG <0.91 0.00 - 0.90 index  HIV antibody  Result Value Ref Range   HIV Screen 4th Generation wRfx Non Reactive Non Reactive    RPR  Result Value Ref Range   RPR Ser Ql Non Reactive Non Reactive  CBC with Differential/Platelet  Result Value Ref Range   WBC 5.2 3.4 - 10.8 x10E3/uL   RBC 5.78 4.14 - 5.80 x10E6/uL   Hemoglobin 16.0 13.0 - 17.7 g/dL   Hematocrit 48.1 37.5 - 51.0 %   MCV 83 79 - 97 fL   MCH 27.7 26.6 - 33.0 pg   MCHC 33.3 31.5 - 35.7 g/dL   RDW 14.0 12.3 - 15.4 %   Platelets 196 150 - 379 x10E3/uL   Neutrophils 45 Not Estab. %   Lymphs 33 Not Estab. %   Monocytes 10 Not Estab. %   Eos 11 Not Estab. %   Basos 1 Not Estab. %   Neutrophils Absolute 2.3 1.4 - 7.0 x10E3/uL   Lymphocytes Absolute 1.7 0.7 - 3.1 x10E3/uL   Monocytes Absolute 0.5 0.1 - 0.9 x10E3/uL   EOS (ABSOLUTE) 0.6 (H) 0.0 - 0.4 x10E3/uL   Basophils Absolute 0.0 0.0 - 0.2 x10E3/uL   Immature Granulocytes 0 Not Estab. %   Immature Grans (Abs) 0.0 0.0 - 0.1 x10E3/uL  Comprehensive metabolic panel  Result Value Ref Range   Glucose 77 65 - 99 mg/dL   BUN 14 6 - 24 mg/dL   Creatinine, Ser 1.13 0.76 - 1.27 mg/dL   GFR calc non Af Amer 80 >59 mL/min/1.73   GFR calc Af Amer 92 >59 mL/min/1.73   BUN/Creatinine Ratio 12 9 - 20   Sodium 143 134 - 144 mmol/L   Potassium 4.0 3.5 - 5.2 mmol/L   Chloride 101 96 - 106 mmol/L   CO2 27 20 - 29 mmol/L   Calcium 9.6 8.7 - 10.2 mg/dL   Total Protein 8.1 6.0 - 8.5 g/dL   Albumin 4.6 3.5 - 5.5 g/dL   Globulin, Total 3.5 1.5 - 4.5 g/dL   Albumin/Globulin Ratio 1.3 1.2 - 2.2   Bilirubin Total 0.5 0.0 - 1.2 mg/dL   Alkaline Phosphatase 59 39 - 117 IU/L   AST 31 0 - 40 IU/L   ALT 59 (H) 0 - 44 IU/L  Lipid Panel w/o Chol/HDL Ratio  Result Value Ref Range   Cholesterol, Total 157 100 - 199 mg/dL   Triglycerides 262 (H) 0 - 149 mg/dL   HDL 49 >39 mg/dL   VLDL Cholesterol Cal 52 (H) 5 - 40 mg/dL   LDL Calculated 56 0 - 99 mg/dL  TSH  Result Value Ref Range   TSH 1.910 0.450 - 4.500 uIU/mL  UA/M w/rflx Culture, Routine  Result Value Ref Range   Specific Gravity, UA 1.020 1.005 - 1.030    pH, UA 7.0 5.0 - 7.5   Color, UA Yellow Yellow   Appearance Ur Clear Clear   Leukocytes, UA Negative Negative   Protein, UA Trace (A) Negative/Trace   Glucose, UA Negative Negative   Ketones, UA Negative  Negative   RBC, UA Trace (A) Negative   Bilirubin, UA Negative Negative   Urobilinogen, Ur 1.0 0.2 - 1.0 mg/dL   Nitrite, UA Negative Negative   Microscopic Examination See below:       Assessment & Plan:   Problem List Items Addressed This Visit      Respiratory   Asthma - Primary    Still only taking advair daily- advised BID. Will start singulair to help with congestion and allergies. Recheck 1-2 months.       Relevant Medications   Fluticasone-Salmeterol (ADVAIR DISKUS) 250-50 MCG/DOSE AEPB   montelukast (SINGULAIR) 10 MG tablet   Other Relevant Orders   Spirometry with Graph (Completed)       Follow up plan: Return 1-2 months, for follow up breathing.

## 2017-01-13 ENCOUNTER — Encounter: Payer: Self-pay | Admitting: Family Medicine

## 2017-01-13 ENCOUNTER — Ambulatory Visit (INDEPENDENT_AMBULATORY_CARE_PROVIDER_SITE_OTHER): Payer: BLUE CROSS/BLUE SHIELD | Admitting: Family Medicine

## 2017-01-13 VITALS — BP 140/87 | HR 76 | Temp 98.3°F | Wt 171.1 lb

## 2017-01-13 DIAGNOSIS — J4520 Mild intermittent asthma, uncomplicated: Secondary | ICD-10-CM

## 2017-01-13 NOTE — Assessment & Plan Note (Signed)
Stable on current regimen. Only taking singulair with changes in in the weather. Continue current regimen. Continue to monitor. Call with any concerns.

## 2017-01-13 NOTE — Progress Notes (Signed)
BP 140/87 (BP Location: Left Arm, Patient Position: Sitting, Cuff Size: Normal)   Pulse 76   Temp 98.3 F (36.8 C)   Wt 171 lb 1 oz (77.6 kg)   SpO2 96%   BMI 25.71 kg/m    Subjective:    Patient ID: Stephen Mercado, male    DOB: 07/19/1975, 41 y.o.   MRN: [id number]  HPI: Stephen Mercado is a 41 y.o. male  Chief Complaint  Patient presents with  . Asthma   ASTHMA- not having any problems with his breathing. Doing well. Not taking the singulair.  Asthma status: controlled Satisfied with current treatment?: yes Albuterol/rescue inhaler frequency: rarely Dyspnea frequency: rarely Wheezing frequency: rarely Cough frequency: rarely Nocturnal symptom frequency: never  Limitation of activity: no Current upper respiratory symptoms: no Aerochamber/spacer use: no Visits to ER or Urgent Care in past year: no Pneumovax: Not up to Date Influenza: Refused  Relevant past medical, surgical, family and social history reviewed and updated as indicated. Interim medical history since our last visit reviewed. Allergies and medications reviewed and updated.  Review of Systems  Constitutional: Negative.   Respiratory: Negative.   Cardiovascular: Negative.   Psychiatric/Behavioral: Negative.     Per HPI unless specifically indicated above     Objective:    BP 140/87 (BP Location: Left Arm, Patient Position: Sitting, Cuff Size: Normal)   Pulse 76   Temp 98.3 F (36.8 C)   Wt 171 lb 1 oz (77.6 kg)   SpO2 96%   BMI 25.71 kg/m   Wt Readings from Last 3 Encounters:  01/13/17 171 lb 1 oz (77.6 kg)  12/02/16 172 lb (78 kg)  10/21/16 169 lb 4 oz (76.8 kg)    Physical Exam  Constitutional: He is oriented to person, place, and time. He appears well-developed and well-nourished. No distress.  HENT:  Head: Normocephalic and atraumatic.  Right Ear: Hearing normal.  Left Ear: Hearing normal.  Nose: Nose normal.  Eyes: Conjunctivae and lids are normal. Right eye exhibits no discharge. Left  eye exhibits no discharge. No scleral icterus.  Cardiovascular: Normal rate, regular rhythm, normal heart sounds and intact distal pulses.  Exam reveals no gallop and no friction rub.   No murmur heard. Pulmonary/Chest: Effort normal and breath sounds normal. No respiratory distress. He has no wheezes. He has no rales. He exhibits no tenderness.  Musculoskeletal: Normal range of motion.  Neurological: He is alert and oriented to person, place, and time.  Skin: Skin is warm, dry and intact. No rash noted. He is not diaphoretic. No erythema. No pallor.  Psychiatric: He has a normal mood and affect. His speech is normal and behavior is normal. Judgment and thought content normal. Cognition and memory are normal.  Nursing note and vitals reviewed.   Results for orders placed or performed in visit on 09/16/16  GC/Chlamydia Probe Amp  Result Value Ref Range   Chlamydia trachomatis, NAA Negative Negative   Neisseria gonorrhoeae by PCR Negative Negative  Microscopic Examination  Result Value Ref Range   WBC, UA 0-5 0 - 5 /hpf   RBC, UA 0-2 0 - 2 /hpf   Epithelial Cells (non renal) 0-10 0 - 10 /hpf   Bacteria, UA None seen None seen/Few  Hepatitis C antibody  Result Value Ref Range   Hep C Virus Ab 0.1 0.0 - 0.9 s/co ratio  HSV(herpes simplex vrs) 1+2 ab-IgG  Result Value Ref Range   HSV 1 Glycoprotein G Ab, IgG <0.91 0.00 - 0.90  index   HSV 2 Glycoprotein G Ab, IgG <0.91 0.00 - 0.90 index  HIV antibody  Result Value Ref Range   HIV Screen 4th Generation wRfx Non Reactive Non Reactive  RPR  Result Value Ref Range   RPR Ser Ql Non Reactive Non Reactive  CBC with Differential/Platelet  Result Value Ref Range   WBC 5.2 3.4 - 10.8 x10E3/uL   RBC 5.78 4.14 - 5.80 x10E6/uL   Hemoglobin 16.0 13.0 - 17.7 g/dL   Hematocrit 48.1 37.5 - 51.0 %   MCV 83 79 - 97 fL   MCH 27.7 26.6 - 33.0 pg   MCHC 33.3 31.5 - 35.7 g/dL   RDW 14.0 12.3 - 15.4 %   Platelets 196 150 - 379 x10E3/uL   Neutrophils  45 Not Estab. %   Lymphs 33 Not Estab. %   Monocytes 10 Not Estab. %   Eos 11 Not Estab. %   Basos 1 Not Estab. %   Neutrophils Absolute 2.3 1.4 - 7.0 x10E3/uL   Lymphocytes Absolute 1.7 0.7 - 3.1 x10E3/uL   Monocytes Absolute 0.5 0.1 - 0.9 x10E3/uL   EOS (ABSOLUTE) 0.6 (H) 0.0 - 0.4 x10E3/uL   Basophils Absolute 0.0 0.0 - 0.2 x10E3/uL   Immature Granulocytes 0 Not Estab. %   Immature Grans (Abs) 0.0 0.0 - 0.1 x10E3/uL  Comprehensive metabolic panel  Result Value Ref Range   Glucose 77 65 - 99 mg/dL   BUN 14 6 - 24 mg/dL   Creatinine, Ser 1.13 0.76 - 1.27 mg/dL   GFR calc non Af Amer 80 >59 mL/min/1.73   GFR calc Af Amer 92 >59 mL/min/1.73   BUN/Creatinine Ratio 12 9 - 20   Sodium 143 134 - 144 mmol/L   Potassium 4.0 3.5 - 5.2 mmol/L   Chloride 101 96 - 106 mmol/L   CO2 27 20 - 29 mmol/L   Calcium 9.6 8.7 - 10.2 mg/dL   Total Protein 8.1 6.0 - 8.5 g/dL   Albumin 4.6 3.5 - 5.5 g/dL   Globulin, Total 3.5 1.5 - 4.5 g/dL   Albumin/Globulin Ratio 1.3 1.2 - 2.2   Bilirubin Total 0.5 0.0 - 1.2 mg/dL   Alkaline Phosphatase 59 39 - 117 IU/L   AST 31 0 - 40 IU/L   ALT 59 (H) 0 - 44 IU/L  Lipid Panel w/o Chol/HDL Ratio  Result Value Ref Range   Cholesterol, Total 157 100 - 199 mg/dL   Triglycerides 262 (H) 0 - 149 mg/dL   HDL 49 >39 mg/dL   VLDL Cholesterol Cal 52 (H) 5 - 40 mg/dL   LDL Calculated 56 0 - 99 mg/dL  TSH  Result Value Ref Range   TSH 1.910 0.450 - 4.500 uIU/mL  UA/M w/rflx Culture, Routine  Result Value Ref Range   Specific Gravity, UA 1.020 1.005 - 1.030   pH, UA 7.0 5.0 - 7.5   Color, UA Yellow Yellow   Appearance Ur Clear Clear   Leukocytes, UA Negative Negative   Protein, UA Trace (A) Negative/Trace   Glucose, UA Negative Negative   Ketones, UA Negative Negative   RBC, UA Trace (A) Negative   Bilirubin, UA Negative Negative   Urobilinogen, Ur 1.0 0.2 - 1.0 mg/dL   Nitrite, UA Negative Negative   Microscopic Examination See below:       Assessment &  Plan:   Problem List Items Addressed This Visit      Respiratory   Asthma - Primary  Stable on current regimen. Only taking singulair with changes in in the weather. Continue current regimen. Continue to monitor. Call with any concerns.          Follow up plan: Return After june 25, for Physical.

## 2017-10-06 ENCOUNTER — Encounter: Payer: Self-pay | Admitting: Family Medicine

## 2017-10-06 ENCOUNTER — Ambulatory Visit (INDEPENDENT_AMBULATORY_CARE_PROVIDER_SITE_OTHER): Payer: BLUE CROSS/BLUE SHIELD | Admitting: Family Medicine

## 2017-10-06 VITALS — BP 137/84 | HR 78 | Temp 98.0°F | Ht 68.0 in | Wt 175.2 lb

## 2017-10-06 DIAGNOSIS — Z0000 Encounter for general adult medical examination without abnormal findings: Secondary | ICD-10-CM | POA: Diagnosis not present

## 2017-10-06 DIAGNOSIS — J4520 Mild intermittent asthma, uncomplicated: Secondary | ICD-10-CM

## 2017-10-06 DIAGNOSIS — Z113 Encounter for screening for infections with a predominantly sexual mode of transmission: Secondary | ICD-10-CM

## 2017-10-06 LAB — UA/M W/RFLX CULTURE, ROUTINE
Bilirubin, UA: NEGATIVE
Glucose, UA: NEGATIVE
Ketones, UA: NEGATIVE
LEUKOCYTES UA: NEGATIVE
Nitrite, UA: NEGATIVE
PH UA: 6 (ref 5.0–7.5)
PROTEIN UA: NEGATIVE
Specific Gravity, UA: 1.025 (ref 1.005–1.030)
Urobilinogen, Ur: 0.2 mg/dL (ref 0.2–1.0)

## 2017-10-06 LAB — MICROSCOPIC EXAMINATION: Bacteria, UA: NONE SEEN

## 2017-10-06 MED ORDER — ALBUTEROL SULFATE HFA 108 (90 BASE) MCG/ACT IN AERS: 2.0000 | INHALATION_SPRAY | Freq: Four times a day (QID) | RESPIRATORY_TRACT | 6 refills | Status: DC | PRN

## 2017-10-06 MED ORDER — FLUTICASONE-SALMETEROL 250-50 MCG/DOSE IN AEPB: 1.0000 | INHALATION_SPRAY | Freq: Two times a day (BID) | RESPIRATORY_TRACT | 12 refills | Status: DC

## 2017-10-06 NOTE — Progress Notes (Signed)
BP 137/84 (BP Location: Left Arm, Patient Position: Sitting, Cuff Size: Large)   Pulse 78   Temp 98 F (36.7 C) (Oral)   Ht 5\' 8"  (1.727 m)   Wt 175 lb 4 oz (79.5 kg)   SpO2 97%   BMI 26.65 kg/m    Subjective:    Patient ID: Stephen Mercado, male    DOB: [birth year], 42 y.o.   MRN: [id number]  HPI: Stephen Mercado is a 42 y.o. male presenting on 10/06/2017 for comprehensive medical examination. Current medical complaints include:none  Interim Problems from his last visit: no  Depression Screen done today and results listed below:  Depression screen [facility] 2/9 09/30/2016  Decreased Interest 0  Down, Depressed, Hopeless 0  PHQ - 2 Score 0    Past Medical History:  Past Medical History:  Diagnosis Date  . Asthma     Surgical History:  Past Surgical History:  Procedure Laterality Date  . ELBOW SURGERY Left    Screws    Medications:  No current outpatient medications on file prior to visit.   No current facility-administered medications on file prior to visit.     Allergies:  No Known Allergies  Social History:  Social History   Socioeconomic History  . Marital status: Divorced    Spouse name: Not on file  . Number of children: Not on file  . Years of education: Not on file  . Highest education level: Not on file  Occupational History  . Not on file  Social Needs  . Financial resource strain: Not on file  . Food insecurity:    Worry: Not on file    Inability: Not on file  . Transportation needs:    Medical: Not on file    Non-medical: Not on file  Tobacco Use  . Smoking status: Never Smoker  . Smokeless tobacco: Never Used  Substance and Sexual Activity  . Alcohol use: Yes    Comment: On occasion  . Drug use: No  . Sexual activity: Yes  Lifestyle  . Physical activity:    Days per week: Not on file    Minutes per session: Not on file  . Stress: Not on file  Relationships  . Social connections:    Talks on phone: Not on file    Gets together: Not on file     Attends religious service: Not on file    Active member of club or organization: Not on file    Attends meetings of clubs or organizations: Not on file    Relationship status: Not on file  . Intimate partner violence:    Fear of current or ex partner: Not on file    Emotionally abused: Not on file    Physically abused: Not on file    Forced sexual activity: Not on file  Other Topics Concern  . Not on file  Social History Narrative  . Not on file   Social History   Tobacco Use  Smoking Status Never Smoker  Smokeless Tobacco Never Used   Social History   Substance and Sexual Activity  Alcohol Use Yes   Comment: On occasion    Family History:  Family History  Problem Relation Age of Onset  . Hypertension Maternal Grandfather   . Asthma Daughter     Past medical history, surgical history, medications, allergies, family history and social history reviewed with patient today and changes made to appropriate areas of the chart.   Review of Systems  Constitutional: Positive  for diaphoresis. Negative for chills, fever, malaise/fatigue and weight loss.  HENT: Negative.   Eyes: Negative.   Respiratory: Negative.   Cardiovascular: Negative.   Gastrointestinal: Negative.   Genitourinary: Negative.   Musculoskeletal: Negative.   Skin: Negative.   Neurological: Positive for headaches. Negative for dizziness, tingling, tremors, sensory change, speech change, focal weakness, seizures, loss of consciousness and weakness.  Endo/Heme/Allergies: Negative.   Psychiatric/Behavioral: Negative.     All other ROS negative except what is listed above and in the HPI.      Objective:    BP 137/84 (BP Location: Left Arm, Patient Position: Sitting, Cuff Size: Large)   Pulse 78   Temp 98 F (36.7 C) (Oral)   Ht 5\' 8"  (1.727 m)   Wt 175 lb 4 oz (79.5 kg)   SpO2 97%   BMI 26.65 kg/m   Wt Readings from Last 3 Encounters:  10/06/17 175 lb 4 oz (79.5 kg)  01/13/17 171 lb 1 oz (77.6  kg)  12/02/16 172 lb (78 kg)    Physical Exam  Constitutional: He is oriented to person, place, and time. He appears well-developed and well-nourished. No distress.  HENT:  Head: Normocephalic and atraumatic.  Right Ear: Hearing, tympanic membrane, external ear and ear canal normal.  Left Ear: Hearing, tympanic membrane, external ear and ear canal normal.  Nose: Nose normal.  Mouth/Throat: Uvula is midline, oropharynx is clear and moist and mucous membranes are normal. No oropharyngeal exudate.  Eyes: Pupils are equal, round, and reactive to light. Conjunctivae, EOM and lids are normal. Right eye exhibits no discharge. Left eye exhibits no discharge. No scleral icterus.  Neck: Normal range of motion. Neck supple. No JVD present. No tracheal deviation present. No thyromegaly present.  Cardiovascular: Normal rate, regular rhythm, normal heart sounds and intact distal pulses. Exam reveals no gallop and no friction rub.  No murmur heard. Pulmonary/Chest: Effort normal and breath sounds normal. No stridor. No respiratory distress. He has no wheezes. He has no rales. He exhibits no tenderness.  Abdominal: Soft. Bowel sounds are normal. He exhibits no distension and no mass. There is no tenderness. There is no rebound and no guarding. No hernia. Hernia confirmed negative in the right inguinal area and confirmed negative in the left inguinal area.  Genitourinary: Testes normal and penis normal. Cremasteric reflex is present. Right testis shows no mass, no swelling and no tenderness. Right testis is descended. Cremasteric reflex is not absent on the right side. Left testis shows no mass, no swelling and no tenderness. Left testis is descended. Cremasteric reflex is not absent on the left side. Circumcised. No phimosis, paraphimosis, hypospadias, penile erythema or penile tenderness. No discharge found.  Musculoskeletal: Normal range of motion. He exhibits no edema, tenderness or deformity.    Lymphadenopathy:    He has no cervical adenopathy.  Neurological: He is alert and oriented to person, place, and time. He displays normal reflexes. No cranial nerve deficit or sensory deficit. He exhibits normal muscle tone. Coordination normal.  Skin: Skin is warm, dry and intact. Capillary refill takes less than 2 seconds. No rash noted. He is not diaphoretic. No erythema. No pallor.  Psychiatric: He has a normal mood and affect. His speech is normal and behavior is normal. Judgment and thought content normal. Cognition and memory are normal.  Nursing note and vitals reviewed.   Results for orders placed or performed in visit on 09/16/16  GC/Chlamydia Probe Amp  Result Value Ref Range   Chlamydia trachomatis,  NAA Negative Negative   Neisseria gonorrhoeae by PCR Negative Negative  Microscopic Examination  Result Value Ref Range   WBC, UA 0-5 0 - 5 /hpf   RBC, UA 0-2 0 - 2 /hpf   Epithelial Cells (non renal) 0-10 0 - 10 /hpf   Bacteria, UA None seen None seen/Few  Hepatitis C antibody  Result Value Ref Range   Hep C Virus Ab 0.1 0.0 - 0.9 s/co ratio  HSV(herpes simplex vrs) 1+2 ab-IgG  Result Value Ref Range   HSV 1 Glycoprotein G Ab, IgG <0.91 0.00 - 0.90 index   HSV 2 Glycoprotein G Ab, IgG <0.91 0.00 - 0.90 index  HIV antibody  Result Value Ref Range   HIV Screen 4th Generation wRfx Non Reactive Non Reactive  RPR  Result Value Ref Range   RPR Ser Ql Non Reactive Non Reactive  CBC with Differential/Platelet  Result Value Ref Range   WBC 5.2 3.4 - 10.8 x10E3/uL   RBC 5.78 4.14 - 5.80 x10E6/uL   Hemoglobin 16.0 13.0 - 17.7 g/dL   Hematocrit 48.1 37.5 - 51.0 %   MCV 83 79 - 97 fL   MCH 27.7 26.6 - 33.0 pg   MCHC 33.3 31.5 - 35.7 g/dL   RDW 14.0 12.3 - 15.4 %   Platelets 196 150 - 379 x10E3/uL   Neutrophils 45 Not Estab. %   Lymphs 33 Not Estab. %   Monocytes 10 Not Estab. %   Eos 11 Not Estab. %   Basos 1 Not Estab. %   Neutrophils Absolute 2.3 1.4 - 7.0 x10E3/uL    Lymphocytes Absolute 1.7 0.7 - 3.1 x10E3/uL   Monocytes Absolute 0.5 0.1 - 0.9 x10E3/uL   EOS (ABSOLUTE) 0.6 (H) 0.0 - 0.4 x10E3/uL   Basophils Absolute 0.0 0.0 - 0.2 x10E3/uL   Immature Granulocytes 0 Not Estab. %   Immature Grans (Abs) 0.0 0.0 - 0.1 x10E3/uL  Comprehensive metabolic panel  Result Value Ref Range   Glucose 77 65 - 99 mg/dL   BUN 14 6 - 24 mg/dL   Creatinine, Ser 1.13 0.76 - 1.27 mg/dL   GFR calc non Af Amer 80 >59 mL/min/1.73   GFR calc Af Amer 92 >59 mL/min/1.73   BUN/Creatinine Ratio 12 9 - 20   Sodium 143 134 - 144 mmol/L   Potassium 4.0 3.5 - 5.2 mmol/L   Chloride 101 96 - 106 mmol/L   CO2 27 20 - 29 mmol/L   Calcium 9.6 8.7 - 10.2 mg/dL   Total Protein 8.1 6.0 - 8.5 g/dL   Albumin 4.6 3.5 - 5.5 g/dL   Globulin, Total 3.5 1.5 - 4.5 g/dL   Albumin/Globulin Ratio 1.3 1.2 - 2.2   Bilirubin Total 0.5 0.0 - 1.2 mg/dL   Alkaline Phosphatase 59 39 - 117 IU/L   AST 31 0 - 40 IU/L   ALT 59 (H) 0 - 44 IU/L  Lipid Panel w/o Chol/HDL Ratio  Result Value Ref Range   Cholesterol, Total 157 100 - 199 mg/dL   Triglycerides 262 (H) 0 - 149 mg/dL   HDL 49 >39 mg/dL   VLDL Cholesterol Cal 52 (H) 5 - 40 mg/dL   LDL Calculated 56 0 - 99 mg/dL  TSH  Result Value Ref Range   TSH 1.910 0.450 - 4.500 uIU/mL  UA/M w/rflx Culture, Routine  Result Value Ref Range   Specific Gravity, UA 1.020 1.005 - 1.030   pH, UA 7.0 5.0 - 7.5   Color,  UA Yellow Yellow   Appearance Ur Clear Clear   Leukocytes, UA Negative Negative   Protein, UA Trace (A) Negative/Trace   Glucose, UA Negative Negative   Ketones, UA Negative Negative   RBC, UA Trace (A) Negative   Bilirubin, UA Negative Negative   Urobilinogen, Ur 1.0 0.2 - 1.0 mg/dL   Nitrite, UA Negative Negative   Microscopic Examination See below:       Assessment & Plan:   Problem List Items Addressed This Visit      Respiratory   Asthma    Under good control. Continue to monitor. Call with any concerns.       Relevant  Medications   albuterol (PROVENTIL HFA;VENTOLIN HFA) 108 (90 Base) MCG/ACT inhaler   Fluticasone-Salmeterol (ADVAIR DISKUS) 250-50 MCG/DOSE AEPB    Other Visit Diagnoses    Routine general medical examination at a health care facility    -  Primary   Vaccines declined. Screening labs checked today. Continue diet and exercise. Call with any concerns.    Relevant Orders   CBC with Differential/Platelet   Comprehensive metabolic panel   Lipid Panel w/o Chol/HDL Ratio   TSH   UA/M w/rflx Culture, Routine   Routine screening for STI (sexually transmitted infection)       Labs drawn today. Await results.    Relevant Orders   GC/Chlamydia Probe Amp   HIV antibody   RPR   HSV(herpes simplex vrs) 1+2 ab-IgG   Hepatitis, Acute       LABORATORY TESTING:  Health maintenance labs ordered today as discussed above.   IMMUNIZATIONS:   - Tdap: Tetanus vaccination status reviewed: Refused. - Influenza: Postponed to flu season - Pneumovax: Not applicable  PATIENT COUNSELING:    Sexuality: Discussed sexually transmitted diseases, partner selection, use of condoms, avoidance of unintended pregnancy  and contraceptive alternatives.   Advised to avoid cigarette smoking.  I discussed with the patient that most people either abstain from alcohol or drink within safe limits (<=14/week and <=4 drinks/occasion for males, <=7/weeks and <= 3 drinks/occasion for females) and that the risk for alcohol disorders and other health effects rises proportionally with the number of drinks per week and how often a drinker exceeds daily limits.  Discussed cessation/primary prevention of drug use and availability of treatment for abuse.   Diet: Encouraged to adjust caloric intake to maintain  or achieve ideal body weight, to reduce intake of dietary saturated fat and total fat, to limit sodium intake by avoiding high sodium foods and not adding table salt, and to maintain adequate dietary potassium and calcium  preferably from fresh fruits, vegetables, and low-fat dairy products.    stressed the importance of regular exercise  Injury prevention: Discussed safety belts, safety helmets, smoke detector, smoking near bedding or upholstery.   Dental health: Discussed importance of regular tooth brushing, flossing, and dental visits.   Follow up plan: NEXT PREVENTATIVE PHYSICAL DUE IN 1 YEAR. Return in about 1 year (around 10/07/2018) for physical.

## 2017-10-06 NOTE — Assessment & Plan Note (Signed)
Under good control. Continue to monitor. Call with any concerns.  

## 2017-10-06 NOTE — Patient Instructions (Signed)

## 2017-10-07 ENCOUNTER — Telehealth: Payer: Self-pay | Admitting: Family Medicine

## 2017-10-07 LAB — GC/CHLAMYDIA PROBE AMP
Chlamydia trachomatis, NAA: NEGATIVE
Neisseria gonorrhoeae by PCR: NEGATIVE

## 2017-10-07 NOTE — Telephone Encounter (Signed)
Patient notified that the labs we have are normal and we will call him with the rest of the results.

## 2017-10-07 NOTE — Telephone Encounter (Addendum)
Please let him know that his labs look nice and normal so far. We are still waiting on the herpes and GC/Chlamyida- someone should let him know those in the next couple of days. Thanks!

## 2017-10-08 LAB — CBC WITH DIFFERENTIAL/PLATELET
BASOS: 1 %
Basophils Absolute: 0 10*3/uL (ref 0.0–0.2)
EOS (ABSOLUTE): 0.6 10*3/uL — AB (ref 0.0–0.4)
EOS: 11 %
HEMATOCRIT: 46.5 % (ref 37.5–51.0)
Hemoglobin: 15.2 g/dL (ref 13.0–17.7)
Immature Grans (Abs): 0 10*3/uL (ref 0.0–0.1)
Immature Granulocytes: 0 %
LYMPHS ABS: 2 10*3/uL (ref 0.7–3.1)
Lymphs: 36 %
MCH: 27.9 pg (ref 26.6–33.0)
MCHC: 32.7 g/dL (ref 31.5–35.7)
MCV: 86 fL (ref 79–97)
MONOS ABS: 0.4 10*3/uL (ref 0.1–0.9)
Monocytes: 7 %
Neutrophils Absolute: 2.5 10*3/uL (ref 1.4–7.0)
Neutrophils: 45 %
Platelets: 201 10*3/uL (ref 150–450)
RBC: 5.44 x10E6/uL (ref 4.14–5.80)
RDW: 13.2 % (ref 12.3–15.4)
WBC: 5.5 10*3/uL (ref 3.4–10.8)

## 2017-10-08 LAB — LIPID PANEL W/O CHOL/HDL RATIO
CHOLESTEROL TOTAL: 160 mg/dL (ref 100–199)
HDL: 38 mg/dL — ABNORMAL LOW (ref >39)
LDL Calculated: 58 mg/dL (ref 0–99)
TRIGLYCERIDES: 320 mg/dL — AB (ref 0–149)
VLDL Cholesterol Cal: 64 mg/dL — ABNORMAL HIGH (ref 5–40)

## 2017-10-08 LAB — HEPATITIS PANEL, ACUTE
HEP A IGM: NEGATIVE
Hep B C IgM: NEGATIVE
Hep C Virus Ab: <0.1 s/co ratio (ref 0.0–0.9)
Hepatitis B Surface Ag: NEGATIVE

## 2017-10-08 LAB — RPR: RPR: NONREACTIVE

## 2017-10-08 LAB — COMPREHENSIVE METABOLIC PANEL
A/G RATIO: 1.5 (ref 1.2–2.2)
ALK PHOS: 61 IU/L (ref 39–117)
ALT: 38 IU/L (ref 0–44)
AST: 23 IU/L (ref 0–40)
Albumin: 4.4 g/dL (ref 3.5–5.5)
BUN/Creatinine Ratio: 14 (ref 9–20)
BUN: 16 mg/dL (ref 6–24)
CHLORIDE: 107 mmol/L — AB (ref 96–106)
CO2: 25 mmol/L (ref 20–29)
Calcium: 9 mg/dL (ref 8.7–10.2)
Creatinine, Ser: 1.16 mg/dL (ref 0.76–1.27)
GFR calc Af Amer: 89 mL/min/{1.73_m2} (ref >59)
GFR, EST NON AFRICAN AMERICAN: 77 mL/min/{1.73_m2} (ref >59)
GLOBULIN, TOTAL: 2.9 g/dL (ref 1.5–4.5)
Glucose: 105 mg/dL — ABNORMAL HIGH (ref 65–99)
POTASSIUM: 4.1 mmol/L (ref 3.5–5.2)
SODIUM: 145 mmol/L — AB (ref 134–144)
Total Protein: 7.3 g/dL (ref 6.0–8.5)

## 2017-10-08 LAB — HIV ANTIBODY (ROUTINE TESTING W REFLEX): HIV SCREEN 4TH GENERATION: NONREACTIVE

## 2017-10-08 LAB — HSV(HERPES SIMPLEX VRS) I + II AB-IGG: HSV 2 IgG, Type Spec: <0.91 index (ref 0.00–0.90)

## 2017-10-08 LAB — TSH: TSH: 1.68 u[IU]/mL (ref 0.450–4.500)

## 2018-08-23 IMAGING — DX DG WRIST COMPLETE 3+V*R*
4 series · 4 of 4 positions shown · non-contrast
Comparison: 01/21/2007

CLINICAL DATA: Right wrist pain for 3 months

EXAM:
RIGHT WRIST - COMPLETE 3+ VIEW

[wrist ap]
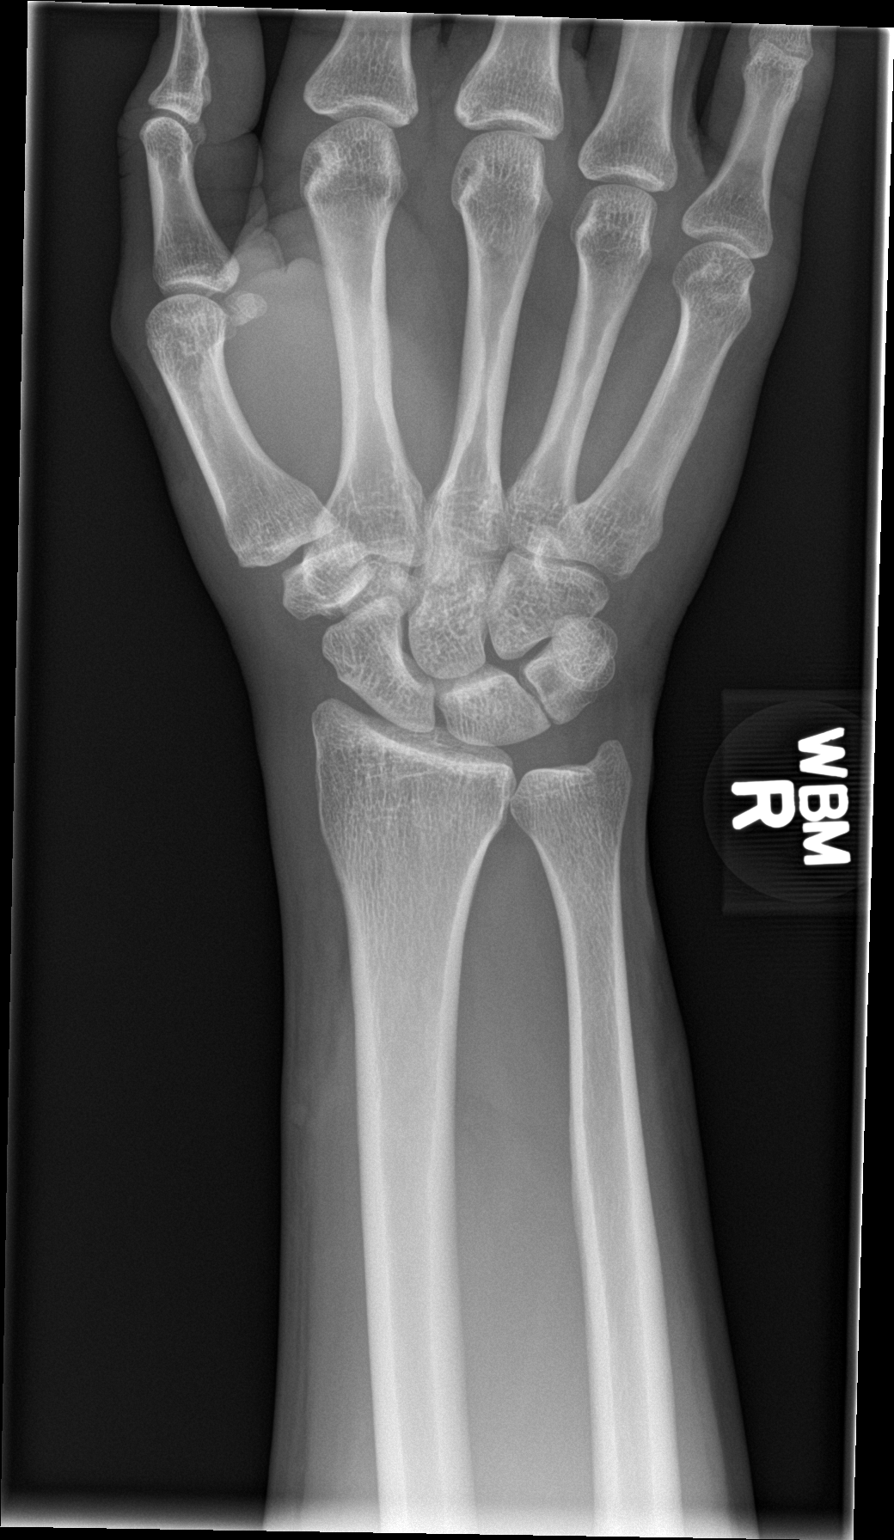

[wrist obl]
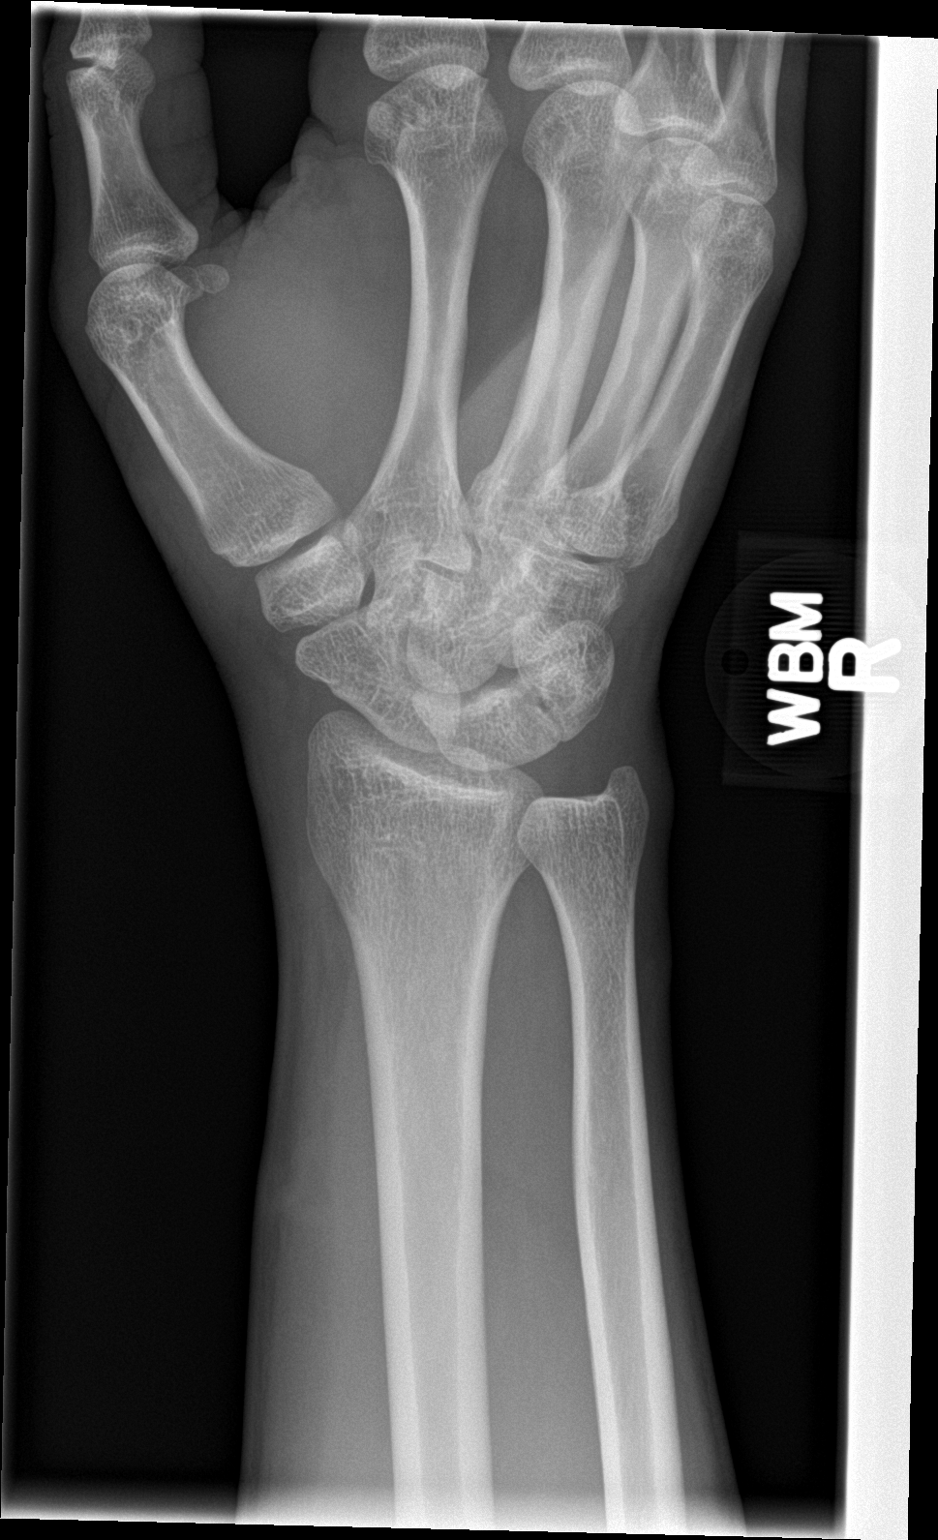

[wrist lat]
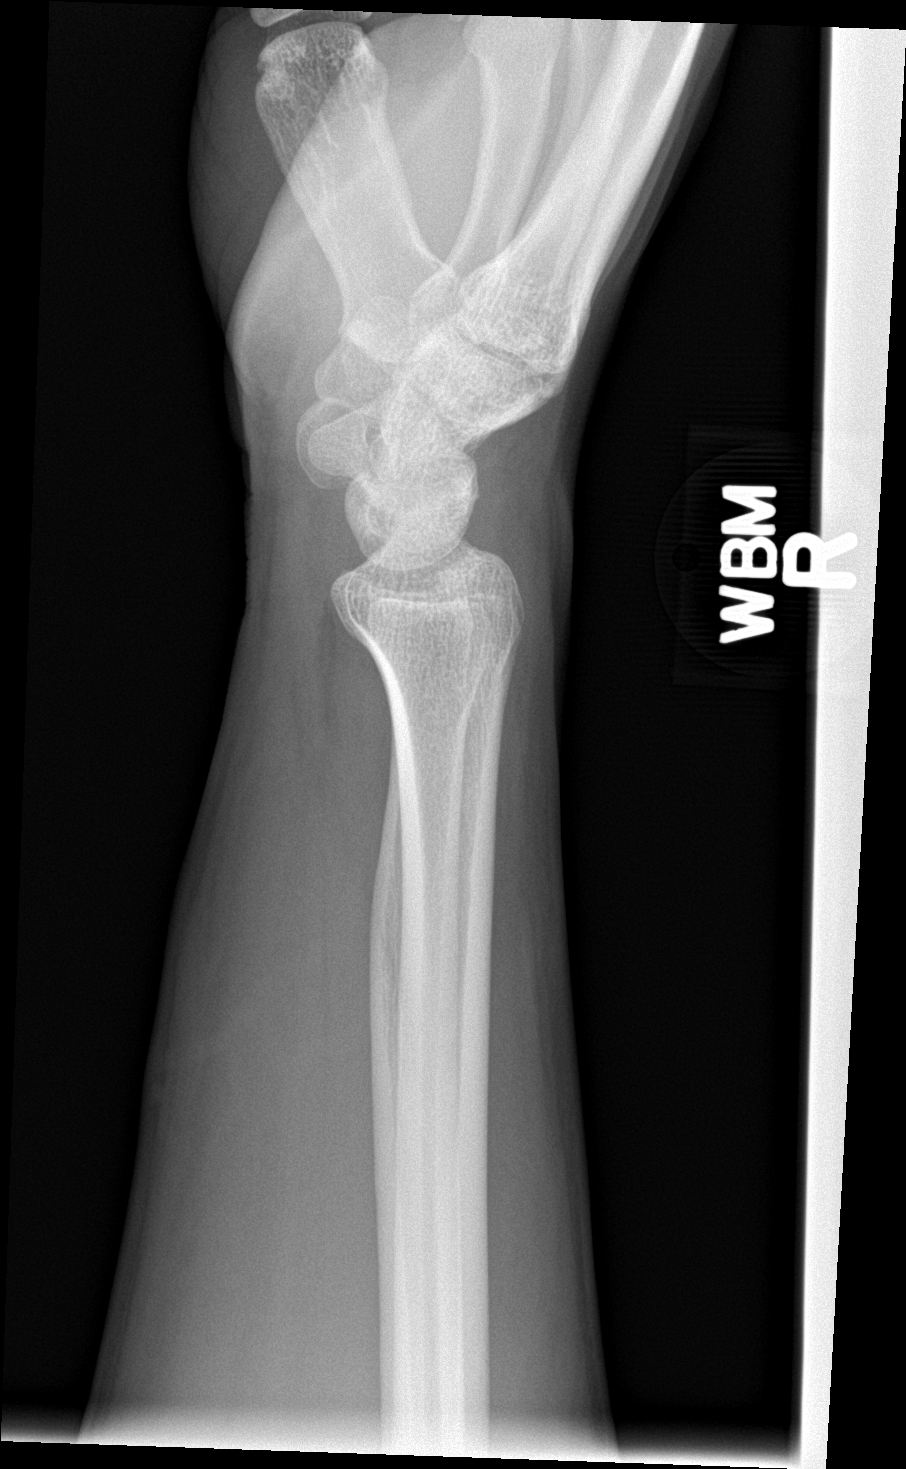

[wrist navicular]
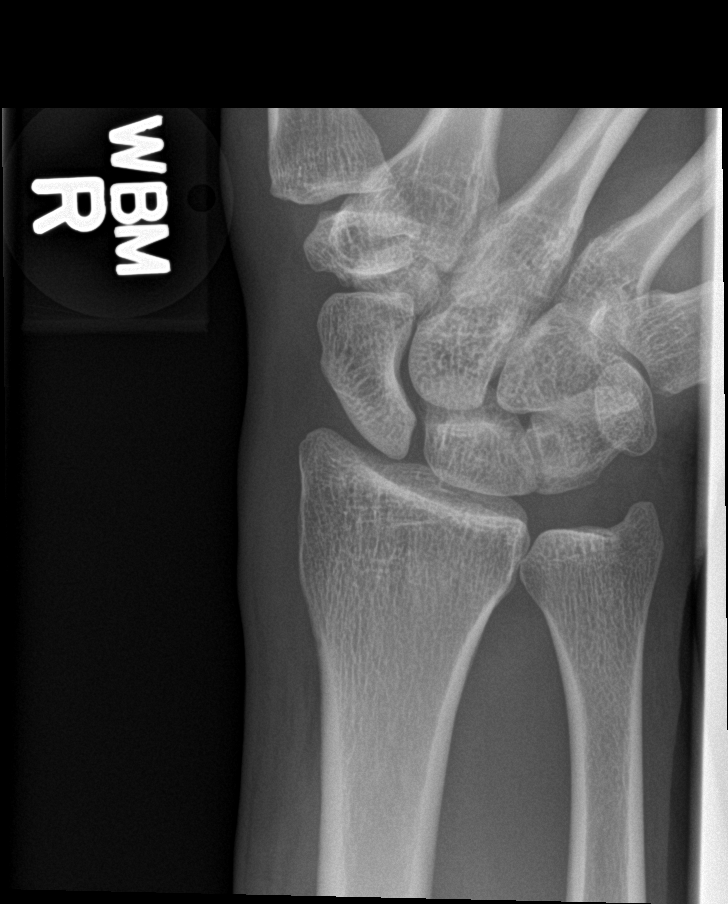

[4 of 4 positions shown; findings below may reference images not displayed]

FINDINGS: There is no evidence of fracture or dislocation. There is no
evidence of arthropathy or other focal bone abnormality. Soft
tissues are unremarkable.
IMPRESSION: Negative.

## 2018-10-12 ENCOUNTER — Other Ambulatory Visit: Payer: Self-pay

## 2018-10-12 ENCOUNTER — Ambulatory Visit (INDEPENDENT_AMBULATORY_CARE_PROVIDER_SITE_OTHER): Payer: BC Managed Care – PPO | Admitting: Family Medicine

## 2018-10-12 ENCOUNTER — Encounter: Payer: Self-pay | Admitting: Family Medicine

## 2018-10-12 VITALS — BP 148/93 | HR 80 | Temp 98.1°F | Ht 68.0 in | Wt 170.0 lb

## 2018-10-12 DIAGNOSIS — Z0000 Encounter for general adult medical examination without abnormal findings: Secondary | ICD-10-CM | POA: Diagnosis not present

## 2018-10-12 DIAGNOSIS — J4520 Mild intermittent asthma, uncomplicated: Secondary | ICD-10-CM

## 2018-10-12 MED ORDER — ALBUTEROL SULFATE HFA 108 (90 BASE) MCG/ACT IN AERS: 2.0000 | INHALATION_SPRAY | Freq: Four times a day (QID) | RESPIRATORY_TRACT | 6 refills | Status: DC | PRN

## 2018-10-12 MED ORDER — FLUTICASONE-SALMETEROL 250-50 MCG/DOSE IN AEPB: 1.0000 | INHALATION_SPRAY | Freq: Two times a day (BID) | RESPIRATORY_TRACT | 3 refills | Status: DC

## 2018-10-12 NOTE — Addendum Note (Signed)
Addended by: Valerie Roys on: 10/12/2018 03:37 PM   Modules accepted: Orders

## 2018-10-12 NOTE — Patient Instructions (Signed)

## 2018-10-12 NOTE — Progress Notes (Signed)
BP (!) 148/93   Pulse 80   Temp 98.1 F (36.7 C) (Oral)   Ht 5\' 8"  (1.727 m)   Wt 170 lb (77.1 kg)   SpO2 98%   BMI 25.85 kg/m    Subjective:    Patient ID: Stephen Mercado, male    DOB: Jun 20, 1975, 43 y.o.   MRN: [id number]  HPI: Stephen Mercado is a 43 y.o. male presenting on 10/12/2018 for comprehensive medical examination. Current medical complaints include:  ASTHMA Asthma status: off and on Satisfied with current treatment?: yes Albuterol/rescue inhaler frequency: rarely Dyspnea frequency: occasionally with work Wheezing frequency: occasionally with work Cough frequency: occasionally with work Nocturnal symptom frequency: never Limitation of activity: no Current upper respiratory symptoms: no Triggers: mist at work Hotel manager use: no Visits to ER or Urgent Care in past year: no Pneumovax: Refused Influenza: Refused  Interim Problems from his last visit: no  Depression Screen done today and results listed below:  Depression screen [facility] 2/9 10/06/2017 09/30/2016  Decreased Interest 0 0  Down, Depressed, Hopeless 0 0  PHQ - 2 Score 0 0  Altered sleeping 0 -  Tired, decreased energy 0 -  Change in appetite 0 -  Feeling bad or failure about yourself  0 -  Trouble concentrating 0 -  Moving slowly or fidgety/restless 0 -  Suicidal thoughts 0 -  PHQ-9 Score 0 -  Difficult doing work/chores Not difficult at all -    Past Medical History:  Past Medical History:  Diagnosis Date  . Asthma     Surgical History:  Past Surgical History:  Procedure Laterality Date  . ELBOW SURGERY Left    Screws    Medications:  No current outpatient medications on file prior to visit.   No current facility-administered medications on file prior to visit.     Allergies:  No Known Allergies  Social History:  Social History   Socioeconomic History  . Marital status: Divorced    Spouse name: Not on file  . Number of children: Not on file  . Years of education: Not on file   . Highest education level: Not on file  Occupational History  . Not on file  Social Needs  . Financial resource strain: Not on file  . Food insecurity    Worry: Not on file    Inability: Not on file  . Transportation needs    Medical: Not on file    Non-medical: Not on file  Tobacco Use  . Smoking status: Never Smoker  . Smokeless tobacco: Never Used  Substance and Sexual Activity  . Alcohol use: Yes    Comment: On occasion  . Drug use: No  . Sexual activity: Yes  Lifestyle  . Physical activity    Days per week: Not on file    Minutes per session: Not on file  . Stress: Not on file  Relationships  . Social Herbalist on phone: Not on file    Gets together: Not on file    Attends religious service: Not on file    Active member of club or organization: Not on file    Attends meetings of clubs or organizations: Not on file    Relationship status: Not on file  . Intimate partner violence    Fear of current or ex partner: Not on file    Emotionally abused: Not on file    Physically abused: Not on file    Forced sexual activity: Not on file  Other Topics Concern  . Not on file  Social History Narrative  . Not on file   Social History   Tobacco Use  Smoking Status Never Smoker  Smokeless Tobacco Never Used   Social History   Substance and Sexual Activity  Alcohol Use Yes   Comment: On occasion    Family History:  Family History  Problem Relation Age of Onset  . Hypertension Maternal Grandfather   . Asthma Daughter     Past medical history, surgical history, medications, allergies, family history and social history reviewed with patient today and changes made to appropriate areas of the chart.   Review of Systems  Constitutional: Negative.   HENT: Negative.   Eyes: Negative.   Respiratory: Negative.   Cardiovascular: Negative.   Gastrointestinal: Negative.   Genitourinary: Negative.   Musculoskeletal: Negative.   Skin: Negative.    Neurological: Negative.   Endo/Heme/Allergies: Negative.   Psychiatric/Behavioral: Negative.     All other ROS negative except what is listed above and in the HPI.      Objective:    BP (!) 148/93   Pulse 80   Temp 98.1 F (36.7 C) (Oral)   Ht 5\' 8"  (1.727 m)   Wt 170 lb (77.1 kg)   SpO2 98%   BMI 25.85 kg/m   Wt Readings from Last 3 Encounters:  10/12/18 170 lb (77.1 kg)  10/06/17 175 lb 4 oz (79.5 kg)  01/13/17 171 lb 1 oz (77.6 kg)    Physical Exam  Results for orders placed or performed in visit on 10/06/17  GC/Chlamydia Probe Amp   Specimen: Urine   UR  Result Value Ref Range   Chlamydia trachomatis, NAA Negative Negative   Neisseria gonorrhoeae by PCR Negative Negative  Microscopic Examination   URINE  Result Value Ref Range   WBC, UA 0-5 0 - 5 /hpf   RBC, UA 0-2 0 - 2 /hpf   Epithelial Cells (non renal) 0-10 0 - 10 /hpf   Bacteria, UA None seen None seen/Few  CBC with Differential/Platelet  Result Value Ref Range   WBC 5.5 3.4 - 10.8 x10E3/uL   RBC 5.44 4.14 - 5.80 x10E6/uL   Hemoglobin 15.2 13.0 - 17.7 g/dL   Hematocrit 46.5 37.5 - 51.0 %   MCV 86 79 - 97 fL   MCH 27.9 26.6 - 33.0 pg   MCHC 32.7 31.5 - 35.7 g/dL   RDW 13.2 12.3 - 15.4 %   Platelets 201 150 - 450 x10E3/uL   Neutrophils 45 Not Estab. %   Lymphs 36 Not Estab. %   Monocytes 7 Not Estab. %   Eos 11 Not Estab. %   Basos 1 Not Estab. %   Neutrophils Absolute 2.5 1.4 - 7.0 x10E3/uL   Lymphocytes Absolute 2.0 0.7 - 3.1 x10E3/uL   Monocytes Absolute 0.4 0.1 - 0.9 x10E3/uL   EOS (ABSOLUTE) 0.6 (H) 0.0 - 0.4 x10E3/uL   Basophils Absolute 0.0 0.0 - 0.2 x10E3/uL   Immature Granulocytes 0 Not Estab. %   Immature Grans (Abs) 0.0 0.0 - 0.1 x10E3/uL  Comprehensive metabolic panel  Result Value Ref Range   Glucose 105 (H) 65 - 99 mg/dL   BUN 16 6 - 24 mg/dL   Creatinine, Ser 1.16 0.76 - 1.27 mg/dL   GFR calc non Af Amer 77 >59 mL/min/1.73   GFR calc Af Amer 89 >59 mL/min/1.73    BUN/Creatinine Ratio 14 9 - 20   Sodium 145 (H) 134 - 144  mmol/L   Potassium 4.1 3.5 - 5.2 mmol/L   Chloride 107 (H) 96 - 106 mmol/L   CO2 25 20 - 29 mmol/L   Calcium 9.0 8.7 - 10.2 mg/dL   Total Protein 7.3 6.0 - 8.5 g/dL   Albumin 4.4 3.5 - 5.5 g/dL   Globulin, Total 2.9 1.5 - 4.5 g/dL   Albumin/Globulin Ratio 1.5 1.2 - 2.2   Bilirubin Total <0.2 0.0 - 1.2 mg/dL   Alkaline Phosphatase 61 39 - 117 IU/L   AST 23 0 - 40 IU/L   ALT 38 0 - 44 IU/L  Lipid Panel w/o Chol/HDL Ratio  Result Value Ref Range   Cholesterol, Total 160 100 - 199 mg/dL   Triglycerides 320 (H) 0 - 149 mg/dL   HDL 38 (L) >39 mg/dL   VLDL Cholesterol Cal 64 (H) 5 - 40 mg/dL   LDL Calculated 58 0 - 99 mg/dL  TSH  Result Value Ref Range   TSH 1.680 0.450 - 4.500 uIU/mL  UA/M w/rflx Culture, Routine   Specimen: Urine   URINE  Result Value Ref Range   Specific Gravity, UA 1.025 1.005 - 1.030   pH, UA 6.0 5.0 - 7.5   Color, UA Yellow Yellow   Appearance Ur Clear Clear   Leukocytes, UA Negative Negative   Protein, UA Negative Negative/Trace   Glucose, UA Negative Negative   Ketones, UA Negative Negative   RBC, UA Trace (A) Negative   Bilirubin, UA Negative Negative   Urobilinogen, Ur 0.2 0.2 - 1.0 mg/dL   Nitrite, UA Negative Negative   Microscopic Examination See below:   HIV antibody  Result Value Ref Range   HIV Screen 4th Generation wRfx Non Reactive Non Reactive  RPR  Result Value Ref Range   RPR Ser Ql Non Reactive Non Reactive  HSV(herpes simplex vrs) 1+2 ab-IgG  Result Value Ref Range   HSV 1 Glycoprotein G Ab, IgG <0.91 0.00 - 0.90 index   HSV 2 IgG, Type Spec <0.91 0.00 - 0.90 index  Hepatitis, Acute  Result Value Ref Range   Hep A IgM Negative Negative   Hepatitis B Surface Ag Negative Negative   Hep B C IgM Negative Negative   Hep C Virus Ab <0.1 0.0 - 0.9 s/co ratio      Assessment & Plan:   Problem List Items Addressed This Visit      Respiratory   Asthma    Stable on current  regimen. Continue to monitor. Call with any concerns. Continue to monitor.       Relevant Medications   Fluticasone-Salmeterol (WIXELA INHUB) 250-50 MCG/DOSE AEPB   albuterol (VENTOLIN HFA) 108 (90 Base) MCG/ACT inhaler    Other Visit Diagnoses    Routine general medical examination at a health care facility    -  Primary   Relevant Orders   CBC with Differential/Platelet   Comprehensive metabolic panel   Lipid Panel w/o Chol/HDL Ratio   TSH   UA/M w/rflx Culture, Routine      LABORATORY TESTING:  Health maintenance labs ordered today as discussed above.   IMMUNIZATIONS:   - Tdap: Tetanus vaccination status reviewed: Refused. - Influenza: Postponed to flu season - Pneumovax: Refused  SCREENING: - Colonoscopy: Not applicable  Discussed with patient purpose of the colonoscopy is to detect colon cancer at curable precancerous or early stages    PATIENT COUNSELING:    Sexuality: Discussed sexually transmitted diseases, partner selection, use of condoms, avoidance of unintended pregnancy  and contraceptive alternatives.   Advised to avoid cigarette smoking.  I discussed with the patient that most people either abstain from alcohol or drink within safe limits (<=14/week and <=4 drinks/occasion for males, <=7/weeks and <= 3 drinks/occasion for females) and that the risk for alcohol disorders and other health effects rises proportionally with the number of drinks per week and how often a drinker exceeds daily limits.  Discussed cessation/primary prevention of drug use and availability of treatment for abuse.   Diet: Encouraged to adjust caloric intake to maintain  or achieve ideal body weight, to reduce intake of dietary saturated fat and total fat, to limit sodium intake by avoiding high sodium foods and not adding table salt, and to maintain adequate dietary potassium and calcium preferably from fresh fruits, vegetables, and low-fat dairy products.    stressed the importance of  regular exercise  Injury prevention: Discussed safety belts, safety helmets, smoke detector, smoking near bedding or upholstery.   Dental health: Discussed importance of regular tooth brushing, flossing, and dental visits.   Follow up plan: NEXT PREVENTATIVE PHYSICAL DUE IN 1 YEAR. Return in about 1 year (around 10/12/2019).

## 2018-10-12 NOTE — Assessment & Plan Note (Signed)
Stable on current regimen. Continue to monitor. Call with any concerns. Continue to monitor.

## 2018-10-13 ENCOUNTER — Encounter: Payer: Self-pay | Admitting: Family Medicine

## 2018-10-13 LAB — COMPREHENSIVE METABOLIC PANEL
ALT: 29 IU/L (ref 0–44)
AST: 21 IU/L (ref 0–40)
Albumin/Globulin Ratio: 1.6 (ref 1.2–2.2)
Albumin: 4.6 g/dL (ref 4.0–5.0)
Alkaline Phosphatase: 60 IU/L (ref 39–117)
BUN/Creatinine Ratio: 13 (ref 9–20)
BUN: 14 mg/dL (ref 6–24)
Bilirubin Total: 0.5 mg/dL (ref 0.0–1.2)
CO2: 25 mmol/L (ref 20–29)
Calcium: 9 mg/dL (ref 8.7–10.2)
Chloride: 103 mmol/L (ref 96–106)
Creatinine, Ser: 1.1 mg/dL (ref 0.76–1.27)
GFR calc Af Amer: 94 mL/min/{1.73_m2} (ref >59)
GFR calc non Af Amer: 81 mL/min/{1.73_m2} (ref >59)
Globulin, Total: 2.9 g/dL (ref 1.5–4.5)
Glucose: 101 mg/dL — ABNORMAL HIGH (ref 65–99)
Potassium: 3.7 mmol/L (ref 3.5–5.2)
Sodium: 143 mmol/L (ref 134–144)
Total Protein: 7.5 g/dL (ref 6.0–8.5)

## 2018-10-13 LAB — UA/M W/RFLX CULTURE, ROUTINE
Bilirubin, UA: NEGATIVE
Glucose, UA: NEGATIVE
Ketones, UA: NEGATIVE
Leukocytes,UA: NEGATIVE
Nitrite, UA: NEGATIVE
Protein,UA: NEGATIVE
Specific Gravity, UA: 1.025 (ref 1.005–1.030)
Urobilinogen, Ur: 0.2 mg/dL (ref 0.2–1.0)
pH, UA: 6 (ref 5.0–7.5)

## 2018-10-13 LAB — CBC WITH DIFFERENTIAL/PLATELET
Basophils Absolute: 0 10*3/uL (ref 0.0–0.2)
Basos: 1 %
EOS (ABSOLUTE): 0.5 10*3/uL — ABNORMAL HIGH (ref 0.0–0.4)
Eos: 8 %
Hematocrit: 46.5 % (ref 37.5–51.0)
Hemoglobin: 15.5 g/dL (ref 13.0–17.7)
Immature Grans (Abs): 0 10*3/uL (ref 0.0–0.1)
Immature Granulocytes: 0 %
Lymphocytes Absolute: 1.8 10*3/uL (ref 0.7–3.1)
Lymphs: 33 %
MCH: 28.1 pg (ref 26.6–33.0)
MCHC: 33.3 g/dL (ref 31.5–35.7)
MCV: 84 fL (ref 79–97)
Monocytes Absolute: 0.7 10*3/uL (ref 0.1–0.9)
Monocytes: 12 %
Neutrophils Absolute: 2.6 10*3/uL (ref 1.4–7.0)
Neutrophils: 46 %
Platelets: 184 10*3/uL (ref 150–450)
RBC: 5.51 x10E6/uL (ref 4.14–5.80)
RDW: 12.7 % (ref 11.6–15.4)
WBC: 5.6 10*3/uL (ref 3.4–10.8)

## 2018-10-13 LAB — LIPID PANEL W/O CHOL/HDL RATIO
Cholesterol, Total: 154 mg/dL (ref 100–199)
HDL: 45 mg/dL (ref >39)
LDL Calculated: 82 mg/dL (ref 0–99)
Triglycerides: 136 mg/dL (ref 0–149)
VLDL Cholesterol Cal: 27 mg/dL (ref 5–40)

## 2018-10-13 LAB — TSH: TSH: 1.93 u[IU]/mL (ref 0.450–4.500)

## 2018-10-13 LAB — MICROSCOPIC EXAMINATION: Bacteria, UA: NONE SEEN

## 2019-08-26 ENCOUNTER — Other Ambulatory Visit: Payer: Self-pay

## 2019-08-26 ENCOUNTER — Encounter: Payer: Self-pay | Admitting: Family Medicine

## 2019-08-26 ENCOUNTER — Ambulatory Visit (INDEPENDENT_AMBULATORY_CARE_PROVIDER_SITE_OTHER): Payer: BC Managed Care – PPO | Admitting: Family Medicine

## 2019-08-26 VITALS — BP 143/89 | HR 68 | Temp 98.3°F | Ht 67.42 in | Wt 168.8 lb

## 2019-08-26 DIAGNOSIS — K921 Melena: Secondary | ICD-10-CM | POA: Diagnosis not present

## 2019-08-26 DIAGNOSIS — R590 Localized enlarged lymph nodes: Secondary | ICD-10-CM | POA: Diagnosis not present

## 2019-08-26 DIAGNOSIS — Z1211 Encounter for screening for malignant neoplasm of colon: Secondary | ICD-10-CM | POA: Diagnosis not present

## 2019-08-26 LAB — CBC WITH DIFFERENTIAL/PLATELET
Hematocrit: 43.6 % (ref 37.5–51.0)
Hemoglobin: 14.8 g/dL (ref 13.0–17.7)
Lymphocytes Absolute: 2 10*3/uL (ref 0.7–3.1)
Lymphs: 36 %
MCH: 29 pg (ref 26.6–33.0)
MCHC: 33.9 g/dL (ref 31.5–35.7)
MCV: 85 fL (ref 79–97)
MID (Absolute): 0.5 10*3/uL (ref 0.1–1.6)
MID: 8 %
Neutrophils Absolute: 3 10*3/uL (ref 1.4–7.0)
Neutrophils: 56 %
Platelets: 170 10*3/uL (ref 150–450)
RBC: 5.11 x10E6/uL (ref 4.14–5.80)
RDW: 13.2 % (ref 11.6–15.4)
WBC: 5.5 10*3/uL (ref 3.4–10.8)

## 2019-08-26 MED ORDER — HYDROCORTISONE ACETATE 25 MG RE SUPP: 25.0000 mg | Freq: Two times a day (BID) | RECTAL | 0 refills | Status: DC

## 2019-08-26 NOTE — Progress Notes (Signed)
BP (!) 143/89 (BP Location: Left Arm, Patient Position: Sitting, Cuff Size: Normal)   Pulse 68   Temp 98.3 F (36.8 C) (Oral)   Ht 5' 7.42" (1.712 m)   Wt 168 lb 12.8 oz (76.6 kg)   SpO2 97%   BMI 26.11 kg/m    Subjective:    Patient ID: Stephen Mercado, male    DOB: 21-Jul-1975, 44 y.o.   MRN: [id number]  HPI: Stephen Mercado is a 44 y.o. male  Chief Complaint  Patient presents with  . Blood In Stools  . Groin Swelling   RECTAL BLEEDING Duration: 3 weeks  Bright red rectal bleeding: yes  Amount of blood: minimal  Frequency: 2-3x in those 3 weeks, not every time he goes Melena: no  Spotting on toilet tissue: yes  Anal fullness: no  Perianal pain: no  Severity: moderate Perianal irritation/itching: no  Constipation: no  Chronic straining/valsava:  no  Anal trauma/intercourse: no  Hemorrhoids: no  Previous colonoscopy: no   LUMP Duration: weeks Location: L inguinal area Onset: gradual Painful: no Discomfort: no Status:  bigger Trauma: no Redness: no Bruising: no Recent infection: no Swollen lymph nodes: yes Requesting removal: no History of cancer: no Family history of cancer: no History of the same: no  Relevant past medical, surgical, family and social history reviewed and updated as indicated. Interim medical history since our last visit reviewed. Allergies and medications reviewed and updated.  Review of Systems  Constitutional: Negative.   Respiratory: Negative.   Cardiovascular: Negative.   Gastrointestinal: Positive for anal bleeding and blood in stool. Negative for abdominal distention, abdominal pain, constipation, diarrhea, nausea, rectal pain and vomiting.  Genitourinary: Negative.   Musculoskeletal: Negative.   Skin: Negative.   Neurological: Negative.   Psychiatric/Behavioral: Negative.     Per HPI unless specifically indicated above     Objective:    BP (!) 143/89 (BP Location: Left Arm, Patient Position: Sitting, Cuff Size: Normal)    Pulse 68   Temp 98.3 F (36.8 C) (Oral)   Ht 5' 7.42" (1.712 m)   Wt 168 lb 12.8 oz (76.6 kg)   SpO2 97%   BMI 26.11 kg/m   Wt Readings from Last 3 Encounters:  08/26/19 168 lb 12.8 oz (76.6 kg)  10/12/18 170 lb (77.1 kg)  10/06/17 175 lb 4 oz (79.5 kg)    Physical Exam Vitals and nursing note reviewed.  Constitutional:      General: He is not in acute distress.    Appearance: Normal appearance. He is not ill-appearing, toxic-appearing or diaphoretic.  HENT:     Head: Normocephalic and atraumatic.     Right Ear: External ear normal.     Left Ear: External ear normal.     Nose: Nose normal.     Mouth/Throat:     Mouth: Mucous membranes are moist.     Pharynx: Oropharynx is clear.  Eyes:     General: No scleral icterus.       Right eye: No discharge.        Left eye: No discharge.     Extraocular Movements: Extraocular movements intact.     Conjunctiva/sclera: Conjunctivae normal.     Pupils: Pupils are equal, round, and reactive to light.  Cardiovascular:     Rate and Rhythm: Normal rate and regular rhythm.     Pulses: Normal pulses.     Heart sounds: Normal heart sounds. No murmur. No friction rub. No gallop.   Pulmonary:  Effort: Pulmonary effort is normal. No respiratory distress.     Breath sounds: Normal breath sounds. No stridor. No wheezing, rhonchi or rales.  Chest:     Chest wall: No tenderness.  Abdominal:     Hernia: There is no hernia in the left inguinal area or right inguinal area.  Genitourinary:    Comments: Small internal hemorrhoid at 11:00 Musculoskeletal:        General: Normal range of motion.     Cervical back: Normal range of motion and neck supple.  Lymphadenopathy:     Lower Body: No right inguinal adenopathy. Left inguinal adenopathy present.  Skin:    General: Skin is warm and dry.     Capillary Refill: Capillary refill takes less than 2 seconds.     Coloration: Skin is not jaundiced or pale.     Findings: No bruising, erythema,  lesion or rash.  Neurological:     General: No focal deficit present.     Mental Status: He is alert and oriented to person, place, and time. Mental status is at baseline.  Psychiatric:        Mood and Affect: Mood normal.        Behavior: Behavior normal.        Thought Content: Thought content normal.        Judgment: Judgment normal.     Results for orders placed or performed in visit on 10/12/18  Microscopic Examination   URINE  Result Value Ref Range   WBC, UA 0-5 0 - 5 /hpf   RBC 0-2 0 - 2 /hpf   Epithelial Cells (non renal) 0-10 0 - 10 /hpf   Bacteria, UA None seen None seen/Few  CBC with Differential/Platelet  Result Value Ref Range   WBC 5.6 3.4 - 10.8 x10E3/uL   RBC 5.51 4.14 - 5.80 x10E6/uL   Hemoglobin 15.5 13.0 - 17.7 g/dL   Hematocrit 46.5 37.5 - 51.0 %   MCV 84 79 - 97 fL   MCH 28.1 26.6 - 33.0 pg   MCHC 33.3 31.5 - 35.7 g/dL   RDW 12.7 11.6 - 15.4 %   Platelets 184 150 - 450 x10E3/uL   Neutrophils 46 Not Estab. %   Lymphs 33 Not Estab. %   Monocytes 12 Not Estab. %   Eos 8 Not Estab. %   Basos 1 Not Estab. %   Neutrophils Absolute 2.6 1.4 - 7.0 x10E3/uL   Lymphocytes Absolute 1.8 0.7 - 3.1 x10E3/uL   Monocytes Absolute 0.7 0.1 - 0.9 x10E3/uL   EOS (ABSOLUTE) 0.5 (H) 0.0 - 0.4 x10E3/uL   Basophils Absolute 0.0 0.0 - 0.2 x10E3/uL   Immature Granulocytes 0 Not Estab. %   Immature Grans (Abs) 0.0 0.0 - 0.1 x10E3/uL  Comprehensive metabolic panel  Result Value Ref Range   Glucose 101 (H) 65 - 99 mg/dL   BUN 14 6 - 24 mg/dL   Creatinine, Ser 1.10 0.76 - 1.27 mg/dL   GFR calc non Af Amer 81 >59 mL/min/1.73   GFR calc Af Amer 94 >59 mL/min/1.73   BUN/Creatinine Ratio 13 9 - 20   Sodium 143 134 - 144 mmol/L   Potassium 3.7 3.5 - 5.2 mmol/L   Chloride 103 96 - 106 mmol/L   CO2 25 20 - 29 mmol/L   Calcium 9.0 8.7 - 10.2 mg/dL   Total Protein 7.5 6.0 - 8.5 g/dL   Albumin 4.6 4.0 - 5.0 g/dL   Globulin, Total 2.9 1.5 - 4.5  g/dL   Albumin/Globulin Ratio 1.6  1.2 - 2.2   Bilirubin Total 0.5 0.0 - 1.2 mg/dL   Alkaline Phosphatase 60 39 - 117 IU/L   AST 21 0 - 40 IU/L   ALT 29 0 - 44 IU/L  Lipid Panel w/o Chol/HDL Ratio  Result Value Ref Range   Cholesterol, Total 154 100 - 199 mg/dL   Triglycerides 136 0 - 149 mg/dL   HDL 45 >39 mg/dL   VLDL Cholesterol Cal 27 5 - 40 mg/dL   LDL Calculated 82 0 - 99 mg/dL  TSH  Result Value Ref Range   TSH 1.930 0.450 - 4.500 uIU/mL  UA/M w/rflx Culture, Routine   Specimen: Urine   URINE  Result Value Ref Range   Specific Gravity, UA 1.025 1.005 - 1.030   pH, UA 6.0 5.0 - 7.5   Color, UA Yellow Yellow   Appearance Ur Clear Clear   Leukocytes,UA Negative Negative   Protein,UA Negative Negative/Trace   Glucose, UA Negative Negative   Ketones, UA Negative Negative   RBC, UA Trace (A) Negative   Bilirubin, UA Negative Negative   Urobilinogen, Ur 0.2 0.2 - 1.0 mg/dL   Nitrite, UA Negative Negative   Microscopic Examination See below:       Assessment & Plan:   Problem List Items Addressed This Visit    None    Visit Diagnoses    Blood in stool    -  Primary   Concern for internal hemorrhoid. Treat with suppositories and check FOBT. He is due for a colonoscopy given new rules and race. Referral generated today.   Relevant Orders   CBC With Differential/Platelet   Ambulatory referral to Gastroenterology   Fecal occult blood, imunochemical([facility])   Screening for colon cancer       He is due for a colonoscopy given new rules and race. Referral generated today.   Relevant Orders   Ambulatory referral to Gastroenterology   Inguinal lymphadenopathy       Of unclear etiology. CBC normal. Will check [place]. Await results.    Relevant Orders   US Pelvis Limited       Follow up plan: Return in about 4 weeks (around 09/23/2019).

## 2019-08-27 ENCOUNTER — Other Ambulatory Visit: Payer: BC Managed Care – PPO

## 2019-08-27 ENCOUNTER — Telehealth: Payer: Self-pay

## 2019-08-27 DIAGNOSIS — K921 Melena: Secondary | ICD-10-CM | POA: Diagnosis not present

## 2019-08-27 NOTE — Telephone Encounter (Signed)
Patient notified and he stated will bring the sample to the office today before 5 pm.

## 2019-08-27 NOTE — Telephone Encounter (Signed)
Copied from [place] [id number]. Topic: General - Other >> Aug 27, 2019 10:10 AM Marya Landry D wrote: Reason for CRM: Patient ci to see he should do with the sample, mail it in or bring by office.pleasen advise

## 2019-08-27 NOTE — Telephone Encounter (Signed)
Please have him bring it to the office

## 2019-08-28 LAB — FECAL OCCULT BLOOD, IMMUNOCHEMICAL: Fecal Occult Bld: NEGATIVE

## 2019-08-29 ENCOUNTER — Encounter: Payer: Self-pay | Admitting: Family Medicine

## 2019-09-03 ENCOUNTER — Other Ambulatory Visit: Payer: Self-pay

## 2019-09-03 ENCOUNTER — Other Ambulatory Visit: Payer: Self-pay | Admitting: Family Medicine

## 2019-09-03 ENCOUNTER — Ambulatory Visit
Admission: RE | Admit: 2019-09-03 | Discharge: 2019-09-03 | Disposition: A | Discharge status: Home / Self Care | Payer: BC Managed Care – PPO | Source: Ambulatory Visit | Attending: Family Medicine | Admitting: Family Medicine

## 2019-09-03 DIAGNOSIS — R590 Localized enlarged lymph nodes: Secondary | ICD-10-CM | POA: Diagnosis not present

## 2019-09-03 DIAGNOSIS — D1724 Benign lipomatous neoplasm of skin and subcutaneous tissue of left leg: Secondary | ICD-10-CM | POA: Diagnosis not present

## 2019-09-07 ENCOUNTER — Other Ambulatory Visit: Payer: Self-pay | Admitting: Family Medicine

## 2019-09-07 DIAGNOSIS — D171 Benign lipomatous neoplasm of skin and subcutaneous tissue of trunk: Secondary | ICD-10-CM

## 2019-09-08 ENCOUNTER — Encounter: Payer: Self-pay | Admitting: *Deleted

## 2019-09-08 ENCOUNTER — Telehealth: Payer: BC Managed Care – PPO

## 2019-09-14 ENCOUNTER — Ambulatory Visit: Payer: BLUE CROSS/BLUE SHIELD | Admitting: Surgery

## 2019-09-21 ENCOUNTER — Ambulatory Visit: Payer: BC Managed Care – PPO | Admitting: Surgery

## 2020-04-06 ENCOUNTER — Encounter: Payer: Self-pay | Admitting: Family Medicine

## 2020-04-06 ENCOUNTER — Telehealth: Payer: Self-pay

## 2020-04-06 ENCOUNTER — Telehealth: Payer: Self-pay | Admitting: Family Medicine

## 2020-04-06 ENCOUNTER — Other Ambulatory Visit: Payer: Self-pay

## 2020-04-06 ENCOUNTER — Ambulatory Visit (INDEPENDENT_AMBULATORY_CARE_PROVIDER_SITE_OTHER): Payer: BC Managed Care – PPO | Admitting: Family Medicine

## 2020-04-06 VITALS — BP 143/98 | HR 73 | Temp 98.0°F

## 2020-04-06 DIAGNOSIS — R300 Dysuria: Secondary | ICD-10-CM

## 2020-04-06 DIAGNOSIS — N342 Other urethritis: Secondary | ICD-10-CM | POA: Diagnosis not present

## 2020-04-06 DIAGNOSIS — Z202 Contact with and (suspected) exposure to infections with a predominantly sexual mode of transmission: Secondary | ICD-10-CM | POA: Diagnosis not present

## 2020-04-06 LAB — URINALYSIS, ROUTINE W REFLEX MICROSCOPIC
Bilirubin, UA: NEGATIVE
Glucose, UA: NEGATIVE
Ketones, UA: NEGATIVE
Leukocytes,UA: NEGATIVE
Nitrite, UA: NEGATIVE
Protein,UA: NEGATIVE
Specific Gravity, UA: >1.03 — ABNORMAL HIGH (ref 1.005–1.030)
Urobilinogen, Ur: 0.2 mg/dL (ref 0.2–1.0)
pH, UA: 5.5 (ref 5.0–7.5)

## 2020-04-06 LAB — MICROSCOPIC EXAMINATION
Bacteria, UA: NONE SEEN
WBC, UA: NONE SEEN /hpf (ref 0–5)

## 2020-04-06 MED ORDER — CEFTRIAXONE SODIUM 500 MG IJ SOLR
500.0000 mg | Freq: Once | INTRAMUSCULAR | Status: AC
  Administered 2020-04-06: 500 mg via INTRAMUSCULAR

## 2020-04-06 MED ORDER — DOXYCYCLINE HYCLATE 100 MG PO TABS
100.0000 mg | ORAL_TABLET | Freq: Two times a day (BID) | ORAL | 0 refills | Status: AC
Start: 2020-04-06 — End: 2020-04-13

## 2020-04-06 NOTE — Patient Instructions (Signed)
It was great to see you!  Our plans for today:  - You will receive antibiotic treatment for presumed sexually transmitted infection. Take the antibiotic as prescribed and make sure to finish the entire course even if you start to feel better. - Refrain from sex for at least seven days or until you finish treatment. Be sure to alert all partners of your testing so they can be tested and treated as well.  - We will call you with the results of your testing.  - Make sure you wear a condom every time you have sex for the full time.   Take care and seek immediate care sooner if you develop any concerns.   Dr. Linwood Dibbles

## 2020-04-06 NOTE — Telephone Encounter (Signed)
I don't have anything- please check to see if any of the other providers do

## 2020-04-06 NOTE — Assessment & Plan Note (Addendum)
With penile discharge after unprotected sex. Testing for GC/CT/trich today, will call with results. Given symptoms, known unprotected sex and prior h/o STI, will offer empiric tx with IM CTX and doxy x7days. Safe sex practices reviewed.

## 2020-04-06 NOTE — Telephone Encounter (Signed)
Pt has apt on 04/06/2020

## 2020-04-06 NOTE — Progress Notes (Signed)
    SUBJECTIVE:   CHIEF COMPLAINT / HPI:   Patient Active Problem List   Diagnosis Date Noted  . Urethritis 04/06/2020  . De Quervain's tenosynovitis, right 09/16/2016  . Asthma    URINARY SYMPTOMS - unprotected sex Monday night, woke up yesterday with penile itching and urethral irritation.  Dysuria: no Urinary frequency: no Urgency: no Urinary incontinence: no Foul odor: no Hematuria: no Abdominal pain: no Back pain: no Suprapubic pain/pressure: no Flank pain: no Fever:  no Vomiting: no Relief with cranberry juice: hasn't tied Relief with pyridium: hasn't tried Status: better/worse/stable Previous urinary tract infection: no Recurrent urinary tract infection: no Sexual activity: yes, unprotected History of sexually transmitted disease: yes, prior chlamydia Penile discharge: yes, yellowish creamy Treatments attempted: none    OBJECTIVE:   BP (!) 143/98   Pulse 73   Temp 98 F (36.7 C)   SpO2 97%   Gen: well appearing, in NAD Genital: circumcised, no lesions visible. Urethral meatus open. No discharge present. No testicular masses. No inguinal hernia.   ASSESSMENT/PLAN:   Urethritis With penile discharge after unprotected sex. Testing for GC/CT/trich today, will call with results. Given symptoms, known unprotected sex and prior h/o STI, will offer empiric tx with IM CTX and doxy x7days. Safe sex practices reviewed.     Caro Laroche, DO

## 2020-04-06 NOTE — Telephone Encounter (Signed)
Please advise 

## 2020-04-06 NOTE — Telephone Encounter (Signed)
Copied from CRM [id number]. Topic: General - Other >> Apr 06, 2020  3:26 PM Wyonia Hough E wrote: Reason for CRM: Pt would like to have a call to go over his labs / he doesn't understand them on his mychart / please advise

## 2020-04-06 NOTE — Telephone Encounter (Signed)
Please see if patient would like to come in next week or refer to urgent care or the health department

## 2020-04-06 NOTE — Telephone Encounter (Signed)
Copied from CRM [id number]. Topic: Appointment Scheduling - Scheduling Inquiry for Clinic >> Apr 06, 2020  9:01 AM Stephen Mercado wrote: Reason for CRM: Patient called to request an appointment today.  States he had sex with someone on Monday and now his penis is red and itchy.  Attempted to contact [facility] line with no success. No available apt for this week can pt be add on or scheduled for next week.

## 2020-04-08 LAB — CHLAMYDIA/GONOCOCCUS/TRICHOMONAS, NAA
Chlamydia by NAA: NEGATIVE
Gonococcus by NAA: NEGATIVE
Trich vag by NAA: NEGATIVE

## 2020-04-10 ENCOUNTER — Encounter: Payer: Self-pay | Admitting: Family Medicine

## 2020-04-27 ENCOUNTER — Encounter: Payer: Self-pay | Admitting: Family Medicine

## 2020-04-27 ENCOUNTER — Other Ambulatory Visit: Payer: BC Managed Care – PPO

## 2020-04-27 ENCOUNTER — Telehealth (INDEPENDENT_AMBULATORY_CARE_PROVIDER_SITE_OTHER): Payer: BC Managed Care – PPO | Admitting: Family Medicine

## 2020-04-27 DIAGNOSIS — Z20822 Contact with and (suspected) exposure to covid-19: Secondary | ICD-10-CM | POA: Diagnosis not present

## 2020-04-27 MED ORDER — BENZONATATE 200 MG PO CAPS
200.0000 mg | ORAL_CAPSULE | Freq: Three times a day (TID) | ORAL | 0 refills | Status: DC | PRN
Start: 2020-04-27 — End: 2020-11-06

## 2020-04-27 NOTE — Addendum Note (Signed)
Addended by: Jerene Pitch on: 04/27/2020 03:59 PM   Modules accepted: Orders

## 2020-04-27 NOTE — Progress Notes (Signed)
There were no vitals taken for this visit.   Subjective:    Patient ID: Stephen Mercado, male    DOB: 1975/04/25, 45 y.o.   MRN: [id number]  HPI: Stephen Mercado is a 45 y.o. male  Chief Complaint  Patient presents with  . Covid Exposure    Pt is having body aches, sore throat, cough with mucus, and headache. Pt states symptoms began yesterday. Was tested for COVID today.    UPPER RESPIRATORY TRACT INFECTION- awaiting results.  Duration: 3 days Worst symptom: sore throat Fever: yes Cough: yes Shortness of breath: no Wheezing: no Chest pain: yes, with cough Chest tightness: no Chest congestion: yes Nasal congestion: yes Runny nose: yes Post nasal drip: yes Sneezing: no Sore throat: yes Swollen glands: no Sinus pressure: yes Headache: yes Face pain: no Toothache: no Ear pain: no  Ear pressure: no  Eyes red/itching:no Eye drainage/crusting: no  Vomiting: no Rash: no Fatigue: no Sick contacts: yes Strep contacts: no  Context: worse Recurrent sinusitis: no Relief with OTC cold/cough medications: yes  Treatments attempted: aleve   Relevant past medical, surgical, family and social history reviewed and updated as indicated. Interim medical history since our last visit reviewed. Allergies and medications reviewed and updated.  Review of Systems  Constitutional: Negative.   HENT: Negative.   Respiratory: Negative.   Cardiovascular: Negative.   Musculoskeletal: Negative.   Psychiatric/Behavioral: Negative.     Per HPI unless specifically indicated above     Objective:    There were no vitals taken for this visit.  Wt Readings from Last 3 Encounters:  08/26/19 168 lb 12.8 oz (76.6 kg)  10/12/18 170 lb (77.1 kg)  10/06/17 175 lb 4 oz (79.5 kg)    Physical Exam Vitals and nursing note reviewed.  Pulmonary:     Effort: Pulmonary effort is normal. No respiratory distress.     Comments: Speaking in full sentences Neurological:     Mental Status: He is alert.   Psychiatric:        Mood and Affect: Mood normal.        Behavior: Behavior normal.        Thought Content: Thought content normal.        Judgment: Judgment normal.     Results for orders placed or performed in visit on 04/06/20  Chlamydia/Gonococcus/Trichomonas, NAA([facility])   Specimen: Urine   UR  Result Value Ref Range   Chlamydia by NAA Negative Negative   Gonococcus by NAA Negative Negative   Trich vag by NAA Negative Negative  Microscopic Examination   Urine  Result Value Ref Range   WBC, UA None seen 0 - 5 /hpf   RBC 0-2 0 - 2 /hpf   Epithelial Cells (non renal) 0-10 0 - 10 /hpf   Bacteria, UA None seen None seen/Few  Urinalysis, Routine w reflex microscopic  Result Value Ref Range   Specific Gravity, UA >1.030 (H) 1.005 - 1.030   pH, UA 5.5 5.0 - 7.5   Color, UA Yellow Yellow   Appearance Ur Clear Clear   Leukocytes,UA Negative Negative   Protein,UA Negative Negative/Trace   Glucose, UA Negative Negative   Ketones, UA Negative Negative   RBC, UA Trace (A) Negative   Bilirubin, UA Negative Negative   Urobilinogen, Ur 0.2 0.2 - 1.0 mg/dL   Nitrite, UA Negative Negative   Microscopic Examination See below:       Assessment & Plan:   Problem List Items Addressed This Visit  None   Visit Diagnoses    Suspected COVID-19 virus infection    -  Primary   Await results. Self-quarantine until results are back. Symptomatic care and tessalon. Call with any concerns.        Follow up plan: Return if symptoms worsen or fail to improve.   . This visit was completed via telephone due to the restrictions of the COVID-19 pandemic. All issues as above were discussed and addressed but no physical exam was performed. If it was felt that the patient should be evaluated in the office, they were directed there. The patient verbally consented to this visit. Patient was unable to complete an audio/visual visit due to Lack of equipment. Due to the catastrophic nature of the  COVID-19 pandemic, this visit was done through audio contact only. . Location of the patient: home . Location of the provider: work . Those involved with this call:  . Provider: Park Liter, DO . CMA: Louanna Raw, [place] . Front Desk/Registration: Jill Side  . Time spent on call: 21 minutes on the phone discussing health concerns. 30 minutes total spent in review of patient's record and preparation of their chart.

## 2020-04-29 ENCOUNTER — Encounter: Payer: Self-pay | Admitting: Family Medicine

## 2020-04-29 LAB — SARS-COV-2, NAA 2 DAY TAT

## 2020-04-29 LAB — NOVEL CORONAVIRUS, NAA: SARS-CoV-2, NAA: DETECTED — AB

## 2020-05-01 NOTE — Progress Notes (Signed)
Called patient, no answer, unable to leave a message, will try again.   

## 2020-05-01 NOTE — Progress Notes (Signed)
Patient notified

## 2020-05-02 ENCOUNTER — Other Ambulatory Visit: Payer: BC Managed Care – PPO

## 2020-11-06 ENCOUNTER — Other Ambulatory Visit: Payer: Self-pay

## 2020-11-06 ENCOUNTER — Encounter: Payer: Self-pay | Admitting: Family Medicine

## 2020-11-06 ENCOUNTER — Ambulatory Visit: Payer: BC Managed Care – PPO | Admitting: Family Medicine

## 2020-11-06 ENCOUNTER — Other Ambulatory Visit: Payer: Self-pay | Admitting: Family Medicine

## 2020-11-06 VITALS — BP 129/78 | HR 82 | Temp 98.2°F | Ht 67.6 in | Wt 173.0 lb

## 2020-11-06 DIAGNOSIS — B353 Tinea pedis: Secondary | ICD-10-CM

## 2020-11-06 DIAGNOSIS — N529 Male erectile dysfunction, unspecified: Secondary | ICD-10-CM

## 2020-11-06 DIAGNOSIS — Z136 Encounter for screening for cardiovascular disorders: Secondary | ICD-10-CM | POA: Diagnosis not present

## 2020-11-06 DIAGNOSIS — Z0000 Encounter for general adult medical examination without abnormal findings: Secondary | ICD-10-CM | POA: Diagnosis not present

## 2020-11-06 DIAGNOSIS — Z1211 Encounter for screening for malignant neoplasm of colon: Secondary | ICD-10-CM

## 2020-11-06 LAB — URINALYSIS, ROUTINE W REFLEX MICROSCOPIC
Bilirubin, UA: NEGATIVE
Glucose, UA: NEGATIVE
Ketones, UA: NEGATIVE
Leukocytes,UA: NEGATIVE
Nitrite, UA: NEGATIVE
Protein,UA: NEGATIVE
Specific Gravity, UA: 1.025 (ref 1.005–1.030)
Urobilinogen, Ur: 0.2 mg/dL (ref 0.2–1.0)
pH, UA: 6 (ref 5.0–7.5)

## 2020-11-06 LAB — MICROSCOPIC EXAMINATION
Bacteria, UA: NONE SEEN
Epithelial Cells (non renal): NONE SEEN /hpf (ref 0–10)
WBC, UA: NONE SEEN /hpf (ref 0–5)

## 2020-11-06 MED ORDER — CLOTRIMAZOLE-BETAMETHASONE 1-0.05 % EX CREA
1.0000 | TOPICAL_CREAM | Freq: Every day | CUTANEOUS | 0 refills | Status: DC
Start: 2020-11-06 — End: 2022-04-22

## 2020-11-06 MED ORDER — SILDENAFIL CITRATE 100 MG PO TABS: 50.0000 mg | ORAL_TABLET | Freq: Every day | ORAL | 11 refills | Status: DC | PRN

## 2020-11-06 NOTE — Telephone Encounter (Signed)
  Has the patient contacted their pharmacy? YES  (Agent: If no, request that the patient contact the pharmacy for the refill.) (Agent: If yes, when and what did the pharmacy advise?)  Preferred Pharmacy (with phone number or street name): [id number] [address]Phone: [phone]: [phone]: Not open 24 hours    Agent: Please be advised that RX refills may take up to 3 business days. We ask that you follow-up with your pharmacy.

## 2020-11-06 NOTE — Progress Notes (Signed)
BP 129/78   Pulse 82   Temp 98.2 F (36.8 C) (Oral)   Ht 5' 7.6" (1.717 m)   Wt 173 lb (78.5 kg)   SpO2 97%   BMI 26.62 kg/m    Subjective:    Patient ID: Stephen Mercado, male    DOB: 1975/04/27, 45 y.o.   MRN: [id number]  HPI: Stephen Mercado is a 45 y.o. male presenting on 11/06/2020 for comprehensive medical examination. Current medical complaints include:  RASH Duration:  chronic - 31 years Location: bilateral bottom of his feet  Itching: yes Burning: no Redness: no Oozing: no Scaling: yes Blisters: no Painful: no Fevers: no Change in detergents/soaps/personal care products: no Recent illness: no Recent travel:no History of same: yes Context: stable Alleviating factors: hydrocortisone cream, benadryl, and lotion/moisturizer Treatments attempted:hydrocortisone cream, benadryl, OTC anit-fungal, and lotion/moisturizer Shortness of breath: no  Throat/tongue swelling: no Myalgias/arthralgias: no  Had some LUQ pain a couple of weeks ago, but it is gone now.   Would like some viagra. No other concerns.   Interim Problems from his last visit: no  Depression Screen done today and results listed below:  Depression screen [facility] 2/9 11/06/2020 04/06/2020 10/06/2017 09/30/2016  Decreased Interest 0 0 0 0  Down, Depressed, Hopeless 1 0 0 0  PHQ - 2 Score 1 0 0 0  Altered sleeping - - 0 -  Tired, decreased energy - - 0 -  Change in appetite - - 0 -  Feeling bad or failure about yourself  - - 0 -  Trouble concentrating - - 0 -  Moving slowly or fidgety/restless - - 0 -  Suicidal thoughts - - 0 -  PHQ-9 Score - - 0 -  Difficult doing work/chores - - Not difficult at all -    Past Medical History:  Past Medical History:  Diagnosis Date   Asthma     Surgical History:  Past Surgical History:  Procedure Laterality Date   ELBOW SURGERY Left    Screws    Medications:  Current Outpatient Medications on File Prior to Visit  Medication Sig   albuterol (VENTOLIN HFA) 108 (90  Base) MCG/ACT inhaler Inhale 2 puffs into the lungs every 6 (six) hours as needed for wheezing or shortness of breath.   No current facility-administered medications on file prior to visit.    Allergies:  No Known Allergies  Social History:  Social History   Socioeconomic History   Marital status: Divorced    Spouse name: Not on file   Number of children: Not on file   Years of education: Not on file   Highest education level: Not on file  Occupational History   Not on file  Tobacco Use   Smoking status: Never   Smokeless tobacco: Never  Vaping Use   Vaping Use: Never used  Substance and Sexual Activity   Alcohol use: Yes    Comment: On occasion   Drug use: No   Sexual activity: Yes  Other Topics Concern   Not on file  Social History Narrative   Not on file   Social Determinants of Health   Financial Resource Strain: Not on file  Food Insecurity: Not on file  Transportation Needs: Not on file  Physical Activity: Not on file  Stress: Not on file  Social Connections: Not on file  Intimate Partner Violence: Not on file   Social History   Tobacco Use  Smoking Status Never  Smokeless Tobacco Never   Social History  Substance and Sexual Activity  Alcohol Use Yes   Comment: On occasion    Family History:  Family History  Problem Relation Age of Onset   Anuerysm Mother    Hypertension Maternal Grandfather    Asthma Daughter    Cancer Maternal Aunt     Past medical history, surgical history, medications, allergies, family history and social history reviewed with patient today and changes made to appropriate areas of the chart.   Review of Systems  Constitutional: Negative.   HENT: Negative.    Eyes: Negative.   Respiratory: Negative.    Cardiovascular: Negative.   Gastrointestinal: Negative.   Genitourinary: Negative.   Musculoskeletal: Negative.   Skin: Negative.   Endo/Heme/Allergies: Negative.   All other ROS negative except what is listed  above and in the HPI.      Objective:    BP 129/78   Pulse 82   Temp 98.2 F (36.8 C) (Oral)   Ht 5' 7.6" (1.717 m)   Wt 173 lb (78.5 kg)   SpO2 97%   BMI 26.62 kg/m   Wt Readings from Last 3 Encounters:  11/06/20 173 lb (78.5 kg)  08/26/19 168 lb 12.8 oz (76.6 kg)  10/12/18 170 lb (77.1 kg)    Physical Exam Vitals and nursing note reviewed.  Constitutional:      General: He is not in acute distress.    Appearance: Normal appearance. He is normal weight. He is not ill-appearing, toxic-appearing or diaphoretic.  HENT:     Head: Normocephalic and atraumatic.     Right Ear: Tympanic membrane, ear canal and external ear normal. There is no impacted cerumen.     Left Ear: Tympanic membrane, ear canal and external ear normal. There is no impacted cerumen.     Nose: Nose normal. No congestion or rhinorrhea.     Mouth/Throat:     Mouth: Mucous membranes are moist.     Pharynx: Oropharynx is clear. No oropharyngeal exudate or posterior oropharyngeal erythema.  Eyes:     General: No scleral icterus.       Right eye: No discharge.        Left eye: No discharge.     Extraocular Movements: Extraocular movements intact.     Conjunctiva/sclera: Conjunctivae normal.     Pupils: Pupils are equal, round, and reactive to light.  Neck:     Vascular: No carotid bruit.  Cardiovascular:     Rate and Rhythm: Normal rate and regular rhythm.     Pulses: Normal pulses.     Heart sounds: No murmur heard.   No friction rub. No gallop.  Pulmonary:     Effort: Pulmonary effort is normal. No respiratory distress.     Breath sounds: Normal breath sounds. No stridor. No wheezing, rhonchi or rales.  Chest:     Chest wall: No tenderness.  Abdominal:     General: Abdomen is flat. Bowel sounds are normal. There is no distension.     Palpations: Abdomen is soft. There is no mass.     Tenderness: There is no abdominal tenderness. There is no right CVA tenderness, left CVA tenderness, guarding or  rebound.     Hernia: No hernia is present.  Genitourinary:    Comments: Genital exam deferred with shared decision making Musculoskeletal:        General: No swelling, tenderness, deformity or signs of injury.     Cervical back: Normal range of motion and neck supple. No rigidity. No muscular tenderness.  Right lower leg: No edema.     Left lower leg: No edema.     Comments: Tinea pedis bilateral bottom of his feet  Lymphadenopathy:     Cervical: No cervical adenopathy.  Skin:    General: Skin is warm and dry.     Capillary Refill: Capillary refill takes less than 2 seconds.     Coloration: Skin is not jaundiced or pale.     Findings: No bruising, erythema, lesion or rash.  Neurological:     General: No focal deficit present.     Mental Status: He is alert and oriented to person, place, and time.     Cranial Nerves: No cranial nerve deficit.     Sensory: No sensory deficit.     Motor: No weakness.     Coordination: Coordination normal.     Gait: Gait normal.     Deep Tendon Reflexes: Reflexes normal.  Psychiatric:        Mood and Affect: Mood normal.        Behavior: Behavior normal.        Thought Content: Thought content normal.        Judgment: Judgment normal.    Results for orders placed or performed in visit on 04/27/20  Novel Coronavirus, NAA ([facility])   Specimen: Nasopharyngeal(NP) swabs in vial transport medium  Result Value Ref Range   SARS-CoV-2, NAA Detected (A) Not Detected  SARS-COV-2, NAA 2 DAY TAT  Result Value Ref Range   SARS-CoV-2, NAA 2 DAY TAT Performed       Assessment & Plan:   Problem List Items Addressed This Visit   None    LABORATORY TESTING:  Health maintenance labs ordered today as discussed above.   The natural history of prostate cancer and ongoing controversy regarding screening and potential treatment outcomes of prostate cancer has been discussed with the patient. The meaning of a false positive PSA and a false negative PSA has  been discussed. He indicates understanding of the limitations of this screening test and wishes to proceed with screening PSA testing.   IMMUNIZATIONS:   - Tdap: Tetanus vaccination status reviewed: refused. - Influenza: Refused - Pneumovax: Refused - Prevnar: Not applicable - COVID: Up to date  SCREENING: - Colonoscopy: cologuard ordered today  Discussed with patient purpose of the colonoscopy is to detect colon cancer at curable precancerous or early stages   PATIENT COUNSELING:    Sexuality: Discussed sexually transmitted diseases, partner selection, use of condoms, avoidance of unintended pregnancy  and contraceptive alternatives.   Advised to avoid cigarette smoking.  I discussed with the patient that most people either abstain from alcohol or drink within safe limits (<=14/week and <=4 drinks/occasion for males, <=7/weeks and <= 3 drinks/occasion for females) and that the risk for alcohol disorders and other health effects rises proportionally with the number of drinks per week and how often a drinker exceeds daily limits.  Discussed cessation/primary prevention of drug use and availability of treatment for abuse.   Diet: Encouraged to adjust caloric intake to maintain  or achieve ideal body weight, to reduce intake of dietary saturated fat and total fat, to limit sodium intake by avoiding high sodium foods and not adding table salt, and to maintain adequate dietary potassium and calcium preferably from fresh fruits, vegetables, and low-fat dairy products.    stressed the importance of regular exercise  Injury prevention: Discussed safety belts, safety helmets, smoke detector, smoking near bedding or upholstery.   Dental health: Discussed importance  of regular tooth brushing, flossing, and dental visits.   Follow up plan: NEXT PREVENTATIVE PHYSICAL DUE IN 1 YEAR. No follow-ups on file.

## 2020-11-06 NOTE — Telephone Encounter (Signed)
Requested medications are due for refill today.  yes  Requested medications are on the active medications list.  yes  Last refill. 10/12/2018  Future visit scheduled.   no  Notes to clinic.  Prescription is expired

## 2020-11-06 NOTE — Telephone Encounter (Signed)
Medication: albuterol (VENTOLIN HFA) 108 (90 Base) MCG/ACT inhaler [id number]   Has the patient contacted their pharmacy? YES  (Agent: If no, request that the patient contact the pharmacy for the refill.) (Agent: If yes, when and what did the pharmacy advise?)  Preferred Pharmacy (with phone number or street name): [id number] [id number]: [phone]: [phone]: Not open 24 hours    Agent: Please be advised that RX refills may take up to 3 business days. We ask that you follow-up with your pharmacy.

## 2020-11-07 LAB — COMPREHENSIVE METABOLIC PANEL
ALT: 29 IU/L (ref 0–44)
AST: 15 IU/L (ref 0–40)
Albumin/Globulin Ratio: 1.3 (ref 1.2–2.2)
Albumin: 4.3 g/dL (ref 4.0–5.0)
Alkaline Phosphatase: 71 IU/L (ref 44–121)
BUN/Creatinine Ratio: 16 (ref 9–20)
BUN: 19 mg/dL (ref 6–24)
Bilirubin Total: 0.2 mg/dL (ref 0.0–1.2)
CO2: 25 mmol/L (ref 20–29)
Calcium: 9.1 mg/dL (ref 8.7–10.2)
Chloride: 103 mmol/L (ref 96–106)
Creatinine, Ser: 1.19 mg/dL (ref 0.76–1.27)
Globulin, Total: 3.3 g/dL (ref 1.5–4.5)
Glucose: 93 mg/dL (ref 65–99)
Potassium: 4.2 mmol/L (ref 3.5–5.2)
Sodium: 141 mmol/L (ref 134–144)
Total Protein: 7.6 g/dL (ref 6.0–8.5)
eGFR: 76 mL/min/{1.73_m2} (ref >59)

## 2020-11-07 LAB — CBC WITH DIFFERENTIAL/PLATELET
Basophils Absolute: 0.1 10*3/uL (ref 0.0–0.2)
Basos: 1 %
EOS (ABSOLUTE): 0.7 10*3/uL — ABNORMAL HIGH (ref 0.0–0.4)
Eos: 12 %
Hematocrit: 46.8 % (ref 37.5–51.0)
Hemoglobin: 15.5 g/dL (ref 13.0–17.7)
Immature Grans (Abs): 0 10*3/uL (ref 0.0–0.1)
Immature Granulocytes: 0 %
Lymphocytes Absolute: 1.8 10*3/uL (ref 0.7–3.1)
Lymphs: 32 %
MCH: 28 pg (ref 26.6–33.0)
MCHC: 33.1 g/dL (ref 31.5–35.7)
MCV: 85 fL (ref 79–97)
Monocytes Absolute: 0.5 10*3/uL (ref 0.1–0.9)
Monocytes: 9 %
Neutrophils Absolute: 2.5 10*3/uL (ref 1.4–7.0)
Neutrophils: 46 %
Platelets: 209 10*3/uL (ref 150–450)
RBC: 5.53 x10E6/uL (ref 4.14–5.80)
RDW: 12.4 % (ref 11.6–15.4)
WBC: 5.5 10*3/uL (ref 3.4–10.8)

## 2020-11-07 LAB — LIPID PANEL W/O CHOL/HDL RATIO
Cholesterol, Total: 145 mg/dL (ref 100–199)
HDL: 42 mg/dL (ref >39)
LDL Chol Calc (NIH): 81 mg/dL (ref 0–99)
Triglycerides: 123 mg/dL (ref 0–149)
VLDL Cholesterol Cal: 22 mg/dL (ref 5–40)

## 2020-11-07 LAB — PSA: Prostate Specific Ag, Serum: 2.9 ng/mL (ref 0.0–4.0)

## 2020-11-07 LAB — TSH: TSH: 2.23 u[IU]/mL (ref 0.450–4.500)

## 2020-11-07 MED ORDER — ALBUTEROL SULFATE HFA 108 (90 BASE) MCG/ACT IN AERS: 2.0000 | INHALATION_SPRAY | Freq: Four times a day (QID) | RESPIRATORY_TRACT | 6 refills | Status: DC | PRN

## 2020-11-07 NOTE — Telephone Encounter (Signed)
Patient seen yesterday.

## 2020-11-15 ENCOUNTER — Other Ambulatory Visit: Payer: Self-pay

## 2020-11-15 ENCOUNTER — Ambulatory Visit: Payer: BC Managed Care – PPO | Admitting: Podiatry

## 2020-11-15 ENCOUNTER — Encounter: Payer: Self-pay | Admitting: Podiatry

## 2020-11-15 DIAGNOSIS — B353 Tinea pedis: Secondary | ICD-10-CM | POA: Diagnosis not present

## 2020-11-15 MED ORDER — TERBINAFINE HCL 250 MG PO TABS
250.0000 mg | ORAL_TABLET | Freq: Every day | ORAL | 0 refills | Status: AC
Start: 2020-11-15 — End: 2020-12-15

## 2020-11-15 NOTE — Progress Notes (Signed)
  Subjective:  Patient ID: Stephen Mercado, male    DOB: 1975-11-30,  MRN: [id number]  Chief Complaint  Patient presents with   Tinea Pedis     np-possible foot fungus-non req-dr. [place] Liter refer    45 y.o. male presents with the above complaint. History confirmed with patient.  He has very dry scaling skin his primary prescribed him Lotrisone cream recently he just started using it  Objective:  Physical Exam: warm, good capillary refill, dry cracking heels, no trophic changes or ulcerative lesions, normal DP and PT pulses, normal sensory exam, and tinea pedis. Assessment:   1. Tinea pedis of both feet      Plan:  Patient was evaluated and treated and all questions answered.  Discussed the etiology and treatment options for tinea pedis.  Discussed topical and oral treatment.  Continue Lotrisone this is what I would have prescribed.  I also prescribed him 4 weeks of terbinafine to augment this and clear this up.  This was sent to the patient's pharmacy.  Also discussed appropriate foot hygiene, use of antifungal spray such as Tinactin in shoes, as well as cleaning foot surfaces such as showers and bathroom floors with bleach.   No follow-ups on file.

## 2020-11-15 NOTE — Patient Instructions (Signed)
Continue using the Lotrisone cream that Dr Wynetta Emery prescribed for you every day on the bottom of the feet and in between

## 2020-12-27 ENCOUNTER — Ambulatory Visit: Payer: BC Managed Care – PPO | Admitting: Podiatry

## 2021-06-26 ENCOUNTER — Ambulatory Visit: Payer: BC Managed Care – PPO | Admitting: Unknown Physician Specialty

## 2021-06-26 ENCOUNTER — Other Ambulatory Visit: Payer: Self-pay

## 2021-06-26 ENCOUNTER — Encounter: Payer: Self-pay | Admitting: Unknown Physician Specialty

## 2021-06-26 VITALS — BP 138/88 | HR 76 | Temp 98.3°F | Wt 172.0 lb

## 2021-06-26 DIAGNOSIS — J069 Acute upper respiratory infection, unspecified: Secondary | ICD-10-CM | POA: Diagnosis not present

## 2021-06-26 NOTE — Progress Notes (Signed)
? ?BP 138/88   Pulse 76   Temp 98.3 ?F (36.8 ?C) (Oral)   Wt 172 lb (78 kg)   SpO2 96%   BMI 26.46 kg/m?   ? ?Subjective:  ? ? Patient ID: Stephen Mercado, male    DOB: 1976-04-03, 46 y.o.   MRN: [id number] ? ?HPI: ?Stephen Mercado is a 46 y.o. male ? ?Chief Complaint  ?Patient presents with  ? Sinusitis  ?  Pt states he has had a runny nose for the past 2 days, denies all other symptoms.  ? ?Sinusitis ?This is a new problem. Episode onset: 2 days. The problem is unchanged. There has been no fever. Pertinent negatives include no chills, congestion, coughing, headaches, sinus pressure, sore throat or swollen glands. Past treatments include oral decongestants. The treatment provided moderate relief.  ? ?Depression screen [facility] 2/9 11/06/2020 04/06/2020 10/06/2017 09/30/2016  ?Decreased Interest 0 0 0 0  ?Down, Depressed, Hopeless 1 0 0 0  ?PHQ - 2 Score 1 0 0 0  ?Altered sleeping - - 0 -  ?Tired, decreased energy - - 0 -  ?Change in appetite - - 0 -  ?Feeling bad or failure about yourself  - - 0 -  ?Trouble concentrating - - 0 -  ?Moving slowly or fidgety/restless - - 0 -  ?Suicidal thoughts - - 0 -  ?PHQ-9 Score - - 0 -  ?Difficult doing work/chores - - Not difficult at all -  ? ? ? ?Relevant past medical, surgical, family and social history reviewed and updated as indicated. Interim medical history since our last visit reviewed. ?Allergies and medications reviewed and updated. ? ?Review of Systems  ?Constitutional:  Negative for chills.  ?HENT:  Negative for congestion, sinus pressure and sore throat.   ?Respiratory:  Negative for cough.   ?Neurological:  Negative for headaches.  ? ?Per HPI unless specifically indicated above ? ?   ?Objective:  ?  ?BP 138/88   Pulse 76   Temp 98.3 ?F (36.8 ?C) (Oral)   Wt 172 lb (78 kg)   SpO2 96%   BMI 26.46 kg/m?   ?Wt Readings from Last 3 Encounters:  ?06/26/21 172 lb (78 kg)  ?11/06/20 173 lb (78.5 kg)  ?08/26/19 168 lb 12.8 oz (76.6 kg)  ?  ?Physical Exam ?Vitals and nursing note  reviewed.  ?Constitutional:   ?   General: He is not in acute distress. ?   Appearance: Normal appearance. He is well-developed.  ?HENT:  ?   Head: Normocephalic and atraumatic.  ?   Right Ear: Tympanic membrane and ear canal normal.  ?   Left Ear: Tympanic membrane and ear canal normal.  ?   Nose: Rhinorrhea present.  ?   Right Sinus: No maxillary sinus tenderness or frontal sinus tenderness.  ?   Left Sinus: No maxillary sinus tenderness or frontal sinus tenderness.  ?   Mouth/Throat:  ?   Pharynx: Uvula midline.  ?Eyes:  ?   General: Lids are normal. No scleral icterus.    ?   Right eye: No discharge.     ?   Left eye: No discharge.  ?   Conjunctiva/sclera: Conjunctivae normal.  ?Cardiovascular:  ?   Rate and Rhythm: Normal rate and regular rhythm.  ?   Heart sounds: Normal heart sounds.  ?Pulmonary:  ?   Effort: Pulmonary effort is normal. No respiratory distress.  ?   Breath sounds: Normal breath sounds.  ?Abdominal:  ?   Palpations: There is  no hepatomegaly or splenomegaly.  ?Musculoskeletal:     ?   General: Normal range of motion.  ?   Cervical back: Neck supple.  ?Skin: ?   General: Skin is warm and dry.  ?   Coloration: Skin is not pale.  ?   Findings: No rash.  ?Neurological:  ?   Mental Status: He is alert and oriented to person, place, and time.  ?Psychiatric:     ?   Behavior: Behavior normal.     ?   Thought Content: Thought content normal.     ?   Judgment: Judgment normal.  ? ? ?Results for orders placed or performed in visit on 11/06/20  ?Microscopic Examination  ? Urine  ?Result Value Ref Range  ? WBC, UA None seen 0 - 5 /hpf  ? RBC 0-2 0 - 2 /hpf  ? Epithelial Cells (non renal) None seen 0 - 10 /hpf  ? Bacteria, UA None seen None seen/Few  ?Comprehensive metabolic panel  ?Result Value Ref Range  ? Glucose 93 65 - 99 mg/dL  ? BUN 19 6 - 24 mg/dL  ? Creatinine, Ser 1.19 0.76 - 1.27 mg/dL  ? eGFR 76 >59 mL/min/1.73  ? BUN/Creatinine Ratio 16 9 - 20  ? Sodium 141 134 - 144 mmol/L  ? Potassium 4.2 3.5  - 5.2 mmol/L  ? Chloride 103 96 - 106 mmol/L  ? CO2 25 20 - 29 mmol/L  ? Calcium 9.1 8.7 - 10.2 mg/dL  ? Total Protein 7.6 6.0 - 8.5 g/dL  ? Albumin 4.3 4.0 - 5.0 g/dL  ? Globulin, Total 3.3 1.5 - 4.5 g/dL  ? Albumin/Globulin Ratio 1.3 1.2 - 2.2  ? Bilirubin Total 0.2 0.0 - 1.2 mg/dL  ? Alkaline Phosphatase 71 44 - 121 IU/L  ? AST 15 0 - 40 IU/L  ? ALT 29 0 - 44 IU/L  ?CBC with Differential/Platelet  ?Result Value Ref Range  ? WBC 5.5 3.4 - 10.8 x10E3/uL  ? RBC 5.53 4.14 - 5.80 x10E6/uL  ? Hemoglobin 15.5 13.0 - 17.7 g/dL  ? Hematocrit 46.8 37.5 - 51.0 %  ? MCV 85 79 - 97 fL  ? MCH 28.0 26.6 - 33.0 pg  ? MCHC 33.1 31.5 - 35.7 g/dL  ? RDW 12.4 11.6 - 15.4 %  ? Platelets 209 150 - 450 x10E3/uL  ? Neutrophils 46 Not Estab. %  ? Lymphs 32 Not Estab. %  ? Monocytes 9 Not Estab. %  ? Eos 12 Not Estab. %  ? Basos 1 Not Estab. %  ? Neutrophils Absolute 2.5 1.4 - 7.0 x10E3/uL  ? Lymphocytes Absolute 1.8 0.7 - 3.1 x10E3/uL  ? Monocytes Absolute 0.5 0.1 - 0.9 x10E3/uL  ? EOS (ABSOLUTE) 0.7 (H) 0.0 - 0.4 x10E3/uL  ? Basophils Absolute 0.1 0.0 - 0.2 x10E3/uL  ? Immature Granulocytes 0 Not Estab. %  ? Immature Grans (Abs) 0.0 0.0 - 0.1 x10E3/uL  ?Lipid Panel w/o Chol/HDL Ratio  ?Result Value Ref Range  ? Cholesterol, Total 145 100 - 199 mg/dL  ? Triglycerides 123 0 - 149 mg/dL  ? HDL 42 >39 mg/dL  ? VLDL Cholesterol Cal 22 5 - 40 mg/dL  ? LDL Chol Calc (NIH) 81 0 - 99 mg/dL  ?PSA  ?Result Value Ref Range  ? Prostate Specific Ag, Serum 2.9 0.0 - 4.0 ng/mL  ?TSH  ?Result Value Ref Range  ? TSH 2.230 0.450 - 4.500 uIU/mL  ?Urinalysis, Routine w reflex microscopic  ?Result  Value Ref Range  ? Specific Gravity, UA 1.025 1.005 - 1.030  ? pH, UA 6.0 5.0 - 7.5  ? Color, UA Yellow Yellow  ? Appearance Ur Clear Clear  ? Leukocytes,UA Negative Negative  ? Protein,UA Negative Negative/Trace  ? Glucose, UA Negative Negative  ? Ketones, UA Negative Negative  ? RBC, UA Trace (A) Negative  ? Bilirubin, UA Negative Negative  ? Urobilinogen, Ur 0.2  0.2 - 1.0 mg/dL  ? Nitrite, UA Negative Negative  ? Microscopic Examination See below:   ? ?   ?Assessment & Plan:  ? ?Problem List Items Addressed This Visit   ?None ?Visit Diagnoses   ? ? Viral upper respiratory tract infection    -  Primary  ? Discussed supportive care, continue with OTC AleveD.  Salt water gargles, rest, and fluids as needed.  Covid testing  ? ?  ?  ? ?Follow up plan: ?Return if symptoms worsen or fail to improve. ? ? ? ? ? ?

## 2021-06-27 LAB — NOVEL CORONAVIRUS, NAA: SARS-CoV-2, NAA: NOT DETECTED

## 2021-11-06 ENCOUNTER — Other Ambulatory Visit: Payer: Self-pay | Admitting: Family Medicine

## 2021-11-07 NOTE — Telephone Encounter (Signed)
Requested Prescriptions  Pending Prescriptions Disp Refills  . albuterol (VENTOLIN HFA) 108 (90 Base) MCG/ACT inhaler [Pharmacy Med Name: ALBUTEROL HFA (VENTOLIN) INH] 18 each 0    Sig: TAKE 2 PUFFS BY MOUTH EVERY 6 HOURS AS NEEDED FOR WHEEZE OR SHORTNESS OF BREATH     Pulmonology:  [facility] 2 Passed - 11/06/2021  2:36 AM      Passed - Last BP in normal range    BP Readings from Last 1 Encounters:  06/26/21 138/88         Passed - Last Heart Rate in normal range    Pulse Readings from Last 1 Encounters:  06/26/21 76         Passed - Valid encounter within last 12 months    Recent Outpatient Visits          4 months ago Viral upper respiratory tract infection   [facility] Kathrine Haddock, NP   1 year ago Routine general medical examination at a health care facility   [facility], [place], DO   1 year ago Suspected COVID-19 virus infection   [place], [place], DO   1 year ago [place], [place], DO   2 years ago Blood in stool   [place], [place], DO

## 2021-12-03 ENCOUNTER — Other Ambulatory Visit: Payer: Self-pay | Admitting: Family Medicine

## 2021-12-03 NOTE — Telephone Encounter (Signed)
Requested Prescriptions  Pending Prescriptions Disp Refills  . albuterol (VENTOLIN HFA) 108 (90 Base) MCG/ACT inhaler [Pharmacy Med Name: ALBUTEROL HFA (PROAIR) INHALER] 18 each 2    Sig: TAKE 2 PUFFS BY MOUTH EVERY 6 HOURS AS NEEDED FOR WHEEZE OR SHORTNESS OF BREATH     Pulmonology:  [facility] 2 Passed - 12/03/2021  2:07 AM      Passed - Last BP in normal range    BP Readings from Last 1 Encounters:  06/26/21 138/88         Passed - Last Heart Rate in normal range    Pulse Readings from Last 1 Encounters:  06/26/21 76         Passed - Valid encounter within last 12 months    Recent Outpatient Visits          5 months ago Viral upper respiratory tract infection   [facility] Kathrine Haddock, NP   1 year ago Routine general medical examination at a health care facility   [facility], [place], DO   1 year ago Suspected COVID-19 virus infection   [place], [place], DO   1 year ago [place], [place], DO   2 years ago Blood in stool   [place], [place], DO

## 2022-01-04 ENCOUNTER — Ambulatory Visit (INDEPENDENT_AMBULATORY_CARE_PROVIDER_SITE_OTHER): Payer: BC Managed Care – PPO | Admitting: Family Medicine

## 2022-01-04 ENCOUNTER — Encounter: Payer: Self-pay | Admitting: Family Medicine

## 2022-01-04 VITALS — BP 122/72 | HR 83 | Temp 98.7°F | Ht 68.0 in | Wt 179.3 lb

## 2022-01-04 DIAGNOSIS — Z0000 Encounter for general adult medical examination without abnormal findings: Secondary | ICD-10-CM | POA: Diagnosis not present

## 2022-01-04 DIAGNOSIS — Z23 Encounter for immunization: Secondary | ICD-10-CM

## 2022-01-04 DIAGNOSIS — Z113 Encounter for screening for infections with a predominantly sexual mode of transmission: Secondary | ICD-10-CM | POA: Diagnosis not present

## 2022-01-04 DIAGNOSIS — Z1211 Encounter for screening for malignant neoplasm of colon: Secondary | ICD-10-CM | POA: Diagnosis not present

## 2022-01-04 DIAGNOSIS — Z136 Encounter for screening for cardiovascular disorders: Secondary | ICD-10-CM | POA: Diagnosis not present

## 2022-01-04 LAB — MICROSCOPIC EXAMINATION
Bacteria, UA: NONE SEEN
Epithelial Cells (non renal): NONE SEEN /hpf (ref 0–10)
RBC, Urine: NONE SEEN /hpf (ref 0–2)

## 2022-01-04 LAB — URINALYSIS, ROUTINE W REFLEX MICROSCOPIC
Bilirubin, UA: NEGATIVE
Glucose, UA: NEGATIVE
Ketones, UA: NEGATIVE
Nitrite, UA: NEGATIVE
Protein,UA: NEGATIVE
RBC, UA: NEGATIVE
Specific Gravity, UA: 1.02 (ref 1.005–1.030)
Urobilinogen, Ur: 1 mg/dL (ref 0.2–1.0)
pH, UA: 7 (ref 5.0–7.5)

## 2022-01-04 MED ORDER — ALBUTEROL SULFATE HFA 108 (90 BASE) MCG/ACT IN AERS: INHALATION_SPRAY | RESPIRATORY_TRACT | 6 refills | Status: DC

## 2022-01-04 MED ORDER — SILDENAFIL CITRATE 100 MG PO TABS: 50.0000 mg | ORAL_TABLET | Freq: Every day | ORAL | 11 refills | Status: DC | PRN

## 2022-01-04 NOTE — Progress Notes (Signed)
BP 122/72   Pulse 83   Temp 98.7 F (37.1 C) (Oral)   Ht '5\' 8"'$  (1.727 m)   Wt 179 lb 4.8 oz (81.3 kg)   SpO2 97%   BMI 27.26 kg/m    Subjective:    Patient ID: Stephen Mercado, male    DOB: 13-Dec-1975, 46 y.o.   MRN: [id number]  HPI: Stephen Mercado is a 46 y.o. male presenting on 01/04/2022 for comprehensive medical examination. Current medical complaints include:  STD SCREENING Sexual activity:  Recent unprotected sexual encounter Contraception: no Recent unprotected intercourse: yes History of sexually transmitted diseases: no Previous sexually transmitted disease screening: yes Genital lesions: no Penile discharge: no Dysuria: no Swollen lymph nodes: no Fevers: no Rash: no  Interim Problems from his last visit: no  Depression Screen done today and results listed below:     01/04/2022   10:32 AM 11/06/2020    9:37 AM 04/06/2020   10:49 AM 10/06/2017    4:46 PM 09/30/2016    4:16 PM  Depression screen PHQ 2/9  Decreased Interest 0 0 0 0 0  Down, Depressed, Hopeless 0 1 0 0 0  PHQ - 2 Score 0 1 0 0 0  Altered sleeping 0   0   Tired, decreased energy 0   0   Change in appetite 0   0   Feeling bad or failure about yourself  0   0   Trouble concentrating 0   0   Moving slowly or fidgety/restless 0   0   Suicidal thoughts 0   0   PHQ-9 Score 0   0   Difficult doing work/chores Not difficult at all   Not difficult at all     Past Medical History:  Past Medical History:  Diagnosis Date   Asthma     Surgical History:  Past Surgical History:  Procedure Laterality Date   ELBOW SURGERY Left    Screws    Medications:  Current Outpatient Medications on File Prior to Visit  Medication Sig   clotrimazole-betamethasone (LOTRISONE) cream Apply 1 application topically daily.   No current facility-administered medications on file prior to visit.    Allergies:  No Known Allergies  Social History:  Social History   Socioeconomic History   Marital status: Divorced     Spouse name: Not on file   Number of children: Not on file   Years of education: Not on file   Highest education level: Not on file  Occupational History   Not on file  Tobacco Use   Smoking status: Never   Smokeless tobacco: Never  Vaping Use   Vaping Use: Never used  Substance and Sexual Activity   Alcohol use: Yes    Comment: On occasion   Drug use: No   Sexual activity: Yes  Other Topics Concern   Not on file  Social History Narrative   Not on file   Social Determinants of Health   Financial Resource Strain: Not on file  Food Insecurity: Not on file  Transportation Needs: Not on file  Physical Activity: Not on file  Stress: Not on file  Social Connections: Not on file  Intimate Partner Violence: Not on file   Social History   Tobacco Use  Smoking Status Never  Smokeless Tobacco Never   Social History   Substance and Sexual Activity  Alcohol Use Yes   Comment: On occasion    Family History:  Family History  Problem Relation Age of  Onset   Anuerysm Mother    Hypertension Maternal Grandfather    Asthma Daughter    Cancer Maternal Aunt     Past medical history, surgical history, medications, allergies, family history and social history reviewed with patient today and changes made to appropriate areas of the chart.   Review of Systems  Constitutional: Negative.   HENT: Negative.    Eyes: Negative.   Respiratory: Negative.    Cardiovascular: Negative.   Gastrointestinal: Negative.   Genitourinary: Negative.   Musculoskeletal: Negative.   Skin: Negative.   Neurological: Negative.   Endo/Heme/Allergies: Negative.   Psychiatric/Behavioral: Negative.     All other ROS negative except what is listed above and in the HPI.      Objective:    BP 122/72   Pulse 83   Temp 98.7 F (37.1 C) (Oral)   Ht '5\' 8"'$  (1.727 m)   Wt 179 lb 4.8 oz (81.3 kg)   SpO2 97%   BMI 27.26 kg/m   Wt Readings from Last 3 Encounters:  01/04/22 179 lb 4.8 oz (81.3 kg)   06/26/21 172 lb (78 kg)  11/06/20 173 lb (78.5 kg)    Physical Exam Vitals and nursing note reviewed.  Constitutional:      General: He is not in acute distress.    Appearance: Normal appearance. He is not ill-appearing, toxic-appearing or diaphoretic.  HENT:     Head: Normocephalic and atraumatic.     Right Ear: Tympanic membrane, ear canal and external ear normal. There is no impacted cerumen.     Left Ear: Tympanic membrane, ear canal and external ear normal. There is no impacted cerumen.     Nose: Nose normal. No congestion or rhinorrhea.     Mouth/Throat:     Mouth: Mucous membranes are moist.     Pharynx: Oropharynx is clear. No oropharyngeal exudate or posterior oropharyngeal erythema.  Eyes:     General: No scleral icterus.       Right eye: No discharge.        Left eye: No discharge.     Extraocular Movements: Extraocular movements intact.     Conjunctiva/sclera: Conjunctivae normal.     Pupils: Pupils are equal, round, and reactive to light.  Neck:     Vascular: No carotid bruit.  Cardiovascular:     Rate and Rhythm: Normal rate and regular rhythm.     Pulses: Normal pulses.     Heart sounds: No murmur heard.    No friction rub. No gallop.  Pulmonary:     Effort: Pulmonary effort is normal. No respiratory distress.     Breath sounds: Normal breath sounds. No stridor. No wheezing, rhonchi or rales.  Chest:     Chest wall: No tenderness.  Abdominal:     General: Abdomen is flat. Bowel sounds are normal. There is no distension.     Palpations: Abdomen is soft. There is no mass.     Tenderness: There is no abdominal tenderness. There is no right CVA tenderness, left CVA tenderness, guarding or rebound.     Hernia: No hernia is present.  Genitourinary:    Comments: Genital exam deferred with shared decision making Musculoskeletal:        General: No swelling, tenderness, deformity or signs of injury.     Cervical back: Normal range of motion and neck supple. No  rigidity. No muscular tenderness.     Right lower leg: No edema.     Left lower leg: No edema.  Lymphadenopathy:     Cervical: No cervical adenopathy.  Skin:    General: Skin is warm and dry.     Capillary Refill: Capillary refill takes less than 2 seconds.     Coloration: Skin is not jaundiced or pale.     Findings: No bruising, erythema, lesion or rash.  Neurological:     General: No focal deficit present.     Mental Status: He is alert and oriented to person, place, and time.     Cranial Nerves: No cranial nerve deficit.     Sensory: No sensory deficit.     Motor: No weakness.     Coordination: Coordination normal.     Gait: Gait normal.     Deep Tendon Reflexes: Reflexes normal.  Psychiatric:        Mood and Affect: Mood normal.        Behavior: Behavior normal.        Thought Content: Thought content normal.        Judgment: Judgment normal.     Results for orders placed or performed in visit on 01/04/22  Microscopic Examination   Urine  Result Value Ref Range   WBC, UA 0-5 0 - 5 /hpf   RBC, Urine None seen 0 - 2 /hpf   Epithelial Cells (non renal) None seen 0 - 10 /hpf   Bacteria, UA None seen None seen/Few  Urinalysis, Routine w reflex microscopic  Result Value Ref Range   Specific Gravity, UA 1.020 1.005 - 1.030   pH, UA 7.0 5.0 - 7.5   Color, UA Yellow Yellow   Appearance Ur Clear Clear   Leukocytes,UA Trace (A) Negative   Protein,UA Negative Negative/Trace   Glucose, UA Negative Negative   Ketones, UA Negative Negative   RBC, UA Negative Negative   Bilirubin, UA Negative Negative   Urobilinogen, Ur 1.0 0.2 - 1.0 mg/dL   Nitrite, UA Negative Negative   Microscopic Examination See below:       Assessment & Plan:   Problem List Items Addressed This Visit   None Visit Diagnoses     Routine general medical examination at a health care facility    -  Primary   Vaccines up to date/declined. Screening labs checked today. Colonoscopy ordered. Continue diet  and exercise. Call with any concerns.    Relevant Orders   Comprehensive metabolic panel   CBC with Differential/Platelet   Lipid Panel w/o Chol/HDL Ratio   PSA   TSH   Urinalysis, Routine w reflex microscopic (Completed)   Routine screening for STI (sexually transmitted infection)       Labs drawn today. Await results.    Relevant Orders   HIV Antibody (routine testing w rflx)   GC/Chlamydia Probe Amp   RPR   HSV(herpes simplex vrs) 1+2 ab-IgG   Acute Viral Hepatitis (HAV, HBV, HCV)   Screening for colon cancer       Referral to GI placed today.   Relevant Orders   Ambulatory referral to Gastroenterology        Discussed aspirin prophylaxis for myocardial infarction prevention and decision was made to stop ASA  LABORATORY TESTING:  Health maintenance labs ordered today as discussed above.   The natural history of prostate cancer and ongoing controversy regarding screening and potential treatment outcomes of prostate cancer has been discussed with the patient. The meaning of a false positive PSA and a false negative PSA has been discussed. He indicates understanding of the limitations of this screening  test and wishes to proceed with screening PSA testing.   IMMUNIZATIONS:   - Tdap: Tetanus vaccination status reviewed: Tdap vaccination indicated and given today. - Influenza: Refused - Pneumovax: Not applicable - Prevnar: Not applicable - COVID: Up to date - HPV: Not applicable - Shingrix vaccine: Not applicable  SCREENING: - Colonoscopy: Up to date  Discussed with patient purpose of the colonoscopy is to detect colon cancer at curable precancerous or early stages   PATIENT COUNSELING:    Sexuality: Discussed sexually transmitted diseases, partner selection, use of condoms, avoidance of unintended pregnancy  and contraceptive alternatives.   Advised to avoid cigarette smoking.  I discussed with the patient that most people either abstain from alcohol or drink within  safe limits (<=14/week and <=4 drinks/occasion for males, <=7/weeks and <= 3 drinks/occasion for females) and that the risk for alcohol disorders and other health effects rises proportionally with the number of drinks per week and how often a drinker exceeds daily limits.  Discussed cessation/primary prevention of drug use and availability of treatment for abuse.   Diet: Encouraged to adjust caloric intake to maintain  or achieve ideal body weight, to reduce intake of dietary saturated fat and total fat, to limit sodium intake by avoiding high sodium foods and not adding table salt, and to maintain adequate dietary potassium and calcium preferably from fresh fruits, vegetables, and low-fat dairy products.    stressed the importance of regular exercise  Injury prevention: Discussed safety belts, safety helmets, smoke detector, smoking near bedding or upholstery.   Dental health: Discussed importance of regular tooth brushing, flossing, and dental visits.   Follow up plan: NEXT PREVENTATIVE PHYSICAL DUE IN 1 YEAR. Return in about 1 year (around 01/05/2023) for physical .

## 2022-01-04 NOTE — Addendum Note (Signed)
Addended by: Valerie Roys on: 01/04/2022 11:20 AM   Modules accepted: Orders

## 2022-01-05 LAB — COMPREHENSIVE METABOLIC PANEL
ALT: 39 IU/L (ref 0–44)
AST: 23 IU/L (ref 0–40)
Albumin/Globulin Ratio: 1.6 (ref 1.2–2.2)
Albumin: 4.6 g/dL (ref 4.1–5.1)
Alkaline Phosphatase: 66 IU/L (ref 44–121)
BUN/Creatinine Ratio: 11 (ref 9–20)
BUN: 11 mg/dL (ref 6–24)
Bilirubin Total: 0.3 mg/dL (ref 0.0–1.2)
CO2: 20 mmol/L (ref 20–29)
Calcium: 9.2 mg/dL (ref 8.7–10.2)
Chloride: 102 mmol/L (ref 96–106)
Creatinine, Ser: 1.03 mg/dL (ref 0.76–1.27)
Globulin, Total: 2.8 g/dL (ref 1.5–4.5)
Glucose: 125 mg/dL — ABNORMAL HIGH (ref 70–99)
Potassium: 4.2 mmol/L (ref 3.5–5.2)
Sodium: 142 mmol/L (ref 134–144)
Total Protein: 7.4 g/dL (ref 6.0–8.5)
eGFR: 90 mL/min/{1.73_m2} (ref >59)

## 2022-01-05 LAB — CBC WITH DIFFERENTIAL/PLATELET
Basophils Absolute: 0 10*3/uL (ref 0.0–0.2)
Basos: 1 %
EOS (ABSOLUTE): 0.2 10*3/uL (ref 0.0–0.4)
Eos: 4 %
Hematocrit: 48.5 % (ref 37.5–51.0)
Hemoglobin: 16.1 g/dL (ref 13.0–17.7)
Immature Grans (Abs): 0 10*3/uL (ref 0.0–0.1)
Immature Granulocytes: 0 %
Lymphocytes Absolute: 1.6 10*3/uL (ref 0.7–3.1)
Lymphs: 31 %
MCH: 28.8 pg (ref 26.6–33.0)
MCHC: 33.2 g/dL (ref 31.5–35.7)
MCV: 87 fL (ref 79–97)
Monocytes Absolute: 0.4 10*3/uL (ref 0.1–0.9)
Monocytes: 9 %
Neutrophils Absolute: 2.8 10*3/uL (ref 1.4–7.0)
Neutrophils: 55 %
Platelets: 191 10*3/uL (ref 150–450)
RBC: 5.6 x10E6/uL (ref 4.14–5.80)
RDW: 13.3 % (ref 11.6–15.4)
WBC: 5.1 10*3/uL (ref 3.4–10.8)

## 2022-01-05 LAB — HSV 1 AND 2 AB, IGG
HSV 1 Glycoprotein G Ab, IgG: <0.91 index (ref 0.00–0.90)
HSV 2 IgG, Type Spec: <0.91 index (ref 0.00–0.90)

## 2022-01-05 LAB — HIV ANTIBODY (ROUTINE TESTING W REFLEX): HIV Screen 4th Generation wRfx: NONREACTIVE

## 2022-01-05 LAB — LIPID PANEL W/O CHOL/HDL RATIO
Cholesterol, Total: 131 mg/dL (ref 100–199)
HDL: 42 mg/dL (ref >39)
LDL Chol Calc (NIH): 50 mg/dL (ref 0–99)
Triglycerides: 246 mg/dL — ABNORMAL HIGH (ref 0–149)
VLDL Cholesterol Cal: 39 mg/dL (ref 5–40)

## 2022-01-05 LAB — PSA: Prostate Specific Ag, Serum: 2.6 ng/mL (ref 0.0–4.0)

## 2022-01-05 LAB — RPR: RPR Ser Ql: NONREACTIVE

## 2022-01-05 LAB — TSH: TSH: 1.4 u[IU]/mL (ref 0.450–4.500)

## 2022-01-07 ENCOUNTER — Other Ambulatory Visit: Payer: Self-pay

## 2022-01-07 ENCOUNTER — Encounter: Payer: Self-pay | Admitting: Family Medicine

## 2022-01-07 ENCOUNTER — Telehealth: Payer: Self-pay

## 2022-01-07 DIAGNOSIS — Z1211 Encounter for screening for malignant neoplasm of colon: Secondary | ICD-10-CM

## 2022-01-07 MED ORDER — NA SULFATE-K SULFATE-MG SULF 17.5-3.13-1.6 GM/177ML PO SOLN: 1.0000 | Freq: Once | ORAL | 0 refills | Status: AC

## 2022-01-07 NOTE — Telephone Encounter (Signed)
Gastroenterology Pre-Procedure Review  Request Date: 02/05/22 Requesting Physician: Dr. Vicente Males  PATIENT REVIEW QUESTIONS: The patient responded to the following health history questions as indicated:    1. Are you having any GI issues? no 2. Do you have a personal history of Polyps? no 3. Do you have a family history of Colon Cancer or Polyps? no 4. Diabetes Mellitus? no 5. Joint replacements in the past 12 months?no 6. Major health problems in the past 3 months?no 7. Any artificial heart valves, MVP, or defibrillator?no    MEDICATIONS & ALLERGIES:    Patient reports the following regarding taking any anticoagulation/antiplatelet therapy:   Plavix, Coumadin, Eliquis, Xarelto, Lovenox, Pradaxa, Brilinta, or Effient? no Aspirin? no  Patient confirms/reports the following medications:  Current Outpatient Medications  Medication Sig Dispense Refill   albuterol (VENTOLIN HFA) 108 (90 Base) MCG/ACT inhaler TAKE 2 PUFFS BY MOUTH EVERY 6 HOURS AS NEEDED FOR WHEEZE OR SHORTNESS OF BREATH 18 each 6   clotrimazole-betamethasone (LOTRISONE) cream Apply 1 application topically daily. 30 g 0   sildenafil (VIAGRA) 100 MG tablet Take 0.5-1 tablets (50-100 mg total) by mouth daily as needed for erectile dysfunction. 5 tablet 11   No current facility-administered medications for this visit.    Patient confirms/reports the following allergies:  No Known Allergies  No orders of the defined types were placed in this encounter.   AUTHORIZATION INFORMATION Primary Insurance: 1D#: Group #:  Secondary Insurance: 1D#: Group #:  SCHEDULE INFORMATION: Date: 02/05/22 Time: Location: [facility]

## 2022-01-08 LAB — GC/CHLAMYDIA PROBE AMP
Chlamydia trachomatis, NAA: NEGATIVE
Neisseria Gonorrhoeae by PCR: NEGATIVE

## 2022-01-18 ENCOUNTER — Telehealth: Payer: Self-pay

## 2022-01-18 NOTE — Telephone Encounter (Signed)
Contacted patient to re-confirm his mailing address.  He confirmed that the mailing address on file is the correct address.  I informed him that his instructions were returned back to the office.  Patient was made aware that his instructions are in his mychart for his 02/05/22 colonoscopy and asked him to sign in to review them at least 3 days before.  I will re-send the instructions again by mail.  Thanks,  [place], [place]

## 2022-01-18 NOTE — Telephone Encounter (Signed)
We received in the mail returned mail for patient colonoscopy instructions

## 2022-01-23 LAB — ACUTE VIRAL HEPATITIS (HAV, HBV, HCV)

## 2022-01-30 MED ORDER — PEG 3350-KCL-NABCB-NACL-NASULF 236 G PO SOLR: ORAL | 0 refills | Status: DC

## 2022-01-30 NOTE — Telephone Encounter (Signed)
Patient called office stating that his prep solution was not filled at [facility]. He stated that he had spoken to [place] and she was supposed to check on it.  I have called [facility] and Rx was not filled because patient's insurance does not cover.  Even though, [facility] had [facility] Suprep which would be $105.36 out of pocket for patient. I went ahead and sent Golyt[facility] prescription to [facility] as well. So I have notified patient the instruction for Golytely and sent it through [facility]. Told patient he can have either Rx depending on his preference since he have to pay out of pocket.

## 2022-01-30 NOTE — Addendum Note (Signed)
Addended by: Jacqualin Combes on: 01/30/2022 02:43 PM   Modules accepted: Orders

## 2022-02-05 ENCOUNTER — Encounter
Admission: RE | Disposition: A | Discharge status: Home / Self Care | Payer: Self-pay | Source: Home / Self Care | Attending: Gastroenterology

## 2022-02-05 ENCOUNTER — Ambulatory Visit: Payer: BC Managed Care – PPO

## 2022-02-05 ENCOUNTER — Other Ambulatory Visit: Payer: Self-pay

## 2022-02-05 ENCOUNTER — Ambulatory Visit
Admission: RE | Admit: 2022-02-05 | Discharge: 2022-02-05 | Disposition: A | Discharge status: Home / Self Care | Payer: BC Managed Care – PPO | Attending: Gastroenterology | Admitting: Gastroenterology

## 2022-02-05 ENCOUNTER — Encounter: Payer: Self-pay | Admitting: Gastroenterology

## 2022-02-05 DIAGNOSIS — D126 Benign neoplasm of colon, unspecified: Secondary | ICD-10-CM | POA: Diagnosis not present

## 2022-02-05 DIAGNOSIS — D122 Benign neoplasm of ascending colon: Secondary | ICD-10-CM | POA: Diagnosis not present

## 2022-02-05 DIAGNOSIS — Z1211 Encounter for screening for malignant neoplasm of colon: Secondary | ICD-10-CM | POA: Diagnosis not present

## 2022-02-05 DIAGNOSIS — D128 Benign neoplasm of rectum: Secondary | ICD-10-CM | POA: Diagnosis not present

## 2022-02-05 DIAGNOSIS — K635 Polyp of colon: Secondary | ICD-10-CM | POA: Diagnosis not present

## 2022-02-05 DIAGNOSIS — J45909 Unspecified asthma, uncomplicated: Secondary | ICD-10-CM | POA: Diagnosis not present

## 2022-02-05 HISTORY — PX: COLONOSCOPY WITH PROPOFOL: SHX5780

## 2022-02-05 SURGERY — COLONOSCOPY WITH PROPOFOL
Anesthesia: General

## 2022-02-05 MED ORDER — PROPOFOL 10 MG/ML IV BOLUS
INTRAVENOUS | Status: DC | PRN
  Administered 2022-02-05: 100 mg via INTRAVENOUS
  Administered 2022-02-05: 40 mg via INTRAVENOUS

## 2022-02-05 MED ORDER — PROPOFOL 500 MG/50ML IV EMUL
INTRAVENOUS | Status: DC | PRN
  Administered 2022-02-05: 140 ug/kg/min via INTRAVENOUS

## 2022-02-05 MED ORDER — LIDOCAINE HCL (CARDIAC) PF 100 MG/5ML IV SOSY
PREFILLED_SYRINGE | INTRAVENOUS | Status: DC | PRN
  Administered 2022-02-05: 50 mg via INTRAVENOUS

## 2022-02-05 MED ORDER — DEXMEDETOMIDINE HCL IN NACL 80 MCG/20ML IV SOLN
INTRAVENOUS | Status: DC | PRN
  Administered 2022-02-05: 8 ug via BUCCAL

## 2022-02-05 MED ORDER — SODIUM CHLORIDE 0.9 % IV SOLN: INTRAVENOUS | Status: DC

## 2022-02-05 NOTE — Anesthesia Postprocedure Evaluation (Signed)
Anesthesia Post Note  Patient: Stephen Mercado  Procedure(s) Performed: COLONOSCOPY WITH PROPOFOL  Patient location during evaluation: Endoscopy Anesthesia Type: General Level of consciousness: awake and alert Pain management: pain level controlled Vital Signs Assessment: post-procedure vital signs reviewed and stable Respiratory status: spontaneous breathing, nonlabored ventilation and respiratory function stable Cardiovascular status: blood pressure returned to baseline and stable Postop Assessment: no apparent nausea or vomiting Anesthetic complications: no   No notable events documented.   Last Vitals:  Vitals:   02/05/22 1100 02/05/22 1108  BP: (!) 108/53 (!) 109/56  Pulse: 69 71  Resp: 18 18  Temp:    SpO2: 99% 98%    Last Pain:  Vitals:   02/05/22 1108  TempSrc:   PainSc: 0-No pain                 Alphonsus Sias

## 2022-02-05 NOTE — Transfer of Care (Signed)
Immediate Anesthesia Transfer of Care Note  Patient: Stephen Mercado  Procedure(s) Performed: COLONOSCOPY WITH PROPOFOL  Patient Location: Endoscopy Unit  Anesthesia Type:General  Level of Consciousness: drowsy  Airway & Oxygen Therapy: Patient Spontanous Breathing  Post-op Assessment: Report given to RN and Post -op Vital signs reviewed and stable  Post vital signs: Reviewed and stable  Last Vitals:  Vitals Value Taken Time  BP 102/61 02/05/22 1048  Temp    Pulse 83 02/05/22 1049  Resp 19 02/05/22 1049  SpO2 98 % 02/05/22 1049  Vitals shown include unvalidated device data.  Last Pain:  Vitals:   02/05/22 1048  TempSrc: (P) Temporal  PainSc: (P) 0-No pain         Complications: No notable events documented.

## 2022-02-05 NOTE — H&P (Signed)
[name], MD [address], [place], [place], [place], [address] [address], [place], [place], [place], [address] Phone: [phone]  Fax: [phone]  Primary Care Physician:  Valerie Roys, DO   Pre-Procedure History & Physical: HPI:  Stephen Mercado is a 46 y.o. male is here for an colonoscopy.   Past Medical History:  Diagnosis Date   Asthma     Past Surgical History:  Procedure Laterality Date   ELBOW SURGERY Left    Screws    Prior to Admission medications   Medication Sig Start Date End Date Taking? Authorizing Provider  albuterol (VENTOLIN HFA) 108 (90 Base) MCG/ACT inhaler TAKE 2 PUFFS BY MOUTH EVERY 6 HOURS AS NEEDED FOR WHEEZE OR SHORTNESS OF BREATH 01/04/22  Yes Johnson, Megan P, DO  polyethylene glycol (GOLYTELY) 236 g solution At 5:00 pm the evening before procedure You will need to fill to the line indicated on the container with a clear liquid. You will need to mix very well and drink 8 ounces every 15-20 minutes until (1/2) of the liquid in container has been completed.On the day of procedure, 5 hours before the scheduled time You will need to drink the rest of the liquid as instructed previously, 8 ounces every 15 minutes until finished within 2 hours. 01/30/22  Yes [name], MD  clotrimazole-betamethasone (LOTRISONE) cream Apply 1 application topically daily. 11/06/20   Johnson, Megan P, DO  sildenafil (VIAGRA) 100 MG tablet Take 0.5-1 tablets (50-100 mg total) by mouth daily as needed for erectile dysfunction. 01/04/22   Valerie Roys, DO    Allergies as of 01/07/2022   (No Known Allergies)    Family History  Problem Relation Age of Onset   Anuerysm Mother    Hypertension Maternal Grandfather    Asthma Daughter    Cancer Maternal Aunt     Social History   Socioeconomic History   Marital status: Divorced    Spouse name: Not on file   Number of children: Not on file   Years of education: Not on file   Highest education level: Not on  file  Occupational History   Not on file  Tobacco Use   Smoking status: Never   Smokeless tobacco: Never  Vaping Use   Vaping Use: Never used  Substance and Sexual Activity   Alcohol use: Yes    Comment: On occasion,none last 24hrs   Drug use: No   Sexual activity: Yes  Other Topics Concern   Not on file  Social History Narrative   Not on file   Social Determinants of Health   Financial Resource Strain: Not on file  Food Insecurity: Not on file  Transportation Needs: Not on file  Physical Activity: Not on file  Stress: Not on file  Social Connections: Not on file  Intimate Partner Violence: Not on file    Review of Systems: See HPI, otherwise negative ROS  Physical Exam: BP (!) 140/91   Pulse 81   Temp (!) 97.4 F (36.3 C) (Temporal)   Resp 16   Ht '5\' 8"'$  (1.727 m)   Wt 81.3 kg   SpO2 99%   BMI 27.25 kg/m  General:   Alert,  pleasant and cooperative in NAD Head:  Normocephalic and atraumatic. Neck:  Supple; no masses or thyromegaly. Lungs:  Clear throughout to auscultation, normal respiratory effort.    Heart:  +S1, +S2, Regular rate and rhythm, No edema. Abdomen:  Soft, nontender and nondistended. Normal bowel  sounds, without guarding, and without rebound.   Neurologic:  Alert and  oriented x4;  grossly normal neurologically.  Impression/Plan: Stephen Mercado is here for an colonoscopy to be performed for Screening colonoscopy average risk   Risks, benefits, limitations, and alternatives regarding  colonoscopy have been reviewed with the patient.  Questions have been answered.  All parties agreeable.   [name], MD  02/05/2022, 10:08 AM

## 2022-02-05 NOTE — Op Note (Signed)
[facility] Patient Name: Stephen Mercado Procedure Date: 02/05/2022 10:17 AM MRN: [id number] Account #: [id number] Date of Birth: November 27, 1975 Admit Type: Outpatient Age: 46 Room: [facility] ENDO ROOM 2 Gender: Male Note Status: Finalized Instrument Name: Jasper Riling [id number] Procedure:             Colonoscopy Indications:           Screening for colorectal malignant neoplasm Providers:             [name] MD, MD Referring MD:          Valerie Roys (Referring MD) Medicines:             Monitored Anesthesia Care Complications:         No immediate complications. Procedure:             Pre-Anesthesia Assessment:                        - Prior to the procedure, a History and Physical was                         performed, and patient medications, allergies and                         sensitivities were reviewed. The patient's tolerance                         of previous anesthesia was reviewed.                        - The risks and benefits of the procedure and the                         sedation options and risks were discussed with the                         patient. All questions were answered and informed                         consent was obtained.                        - ASA Grade Assessment: II - A patient with mild                         systemic disease.                        After obtaining informed consent, the colonoscope was                         passed under direct vision. Throughout the procedure,                         the patient's blood pressure, pulse, and oxygen                         saturations were monitored continuously. The                         Colonoscope was introduced  through the anus and                         advanced to the the cecum, identified by the                         appendiceal orifice. The colonoscopy was performed                         with ease. The patient tolerated the procedure well.                          The quality of the bowel preparation was excellent.                         The appendiceal orifice was photographed. Findings:      The perianal and digital rectal examinations were normal.      Two sessile polyps were found in the descending colon and ascending       colon. The polyps were 5 to 6 mm in size. These polyps were removed with       a cold snare. Resection and retrieval were complete.      A 20 mm polyp was found in the rectum. The polyp was sessile.       Preparations were made for mucosal resection. Demarcation of the lesion       was performed with narrow band imaging to clearly identify the       boundaries of the lesion. Saline was injected to raise the lesion. Snare       mucosal resection was performed. Resection and retrieval were complete.       Resected tissue margins were examined and clear of polyp tissue. To       prevent bleeding after mucosal resection, one hemostatic clip was       successfully placed. There was no bleeding during, or at the end, of the       procedure.      The exam was otherwise without abnormality on direct and retroflexion       views. Impression:            - Two 5 to 6 mm polyps in the descending colon and in                         the ascending colon, removed with a cold snare.                         Resected and retrieved.                        - One 20 mm polyp in the rectum, removed with mucosal                         resection. Resected and retrieved. Clip was placed.                        - The examination was otherwise normal on direct and                         retroflexion views.                        -  Mucosal resection was performed. Resection and                         retrieval were complete. Recommendation:        - Discharge patient to home (with escort).                        - Resume previous diet.                        - Continue present medications.                        - Await  pathology results.                        - Repeat colonoscopy for surveillance based on                         pathology results. Procedure Code(s):     --- Professional ---                        [phone], Colonoscopy, flexible; with endoscopic mucosal                         resection                        [phone], 67, Colonoscopy, flexible; with removal of                         tumor(s), polyp(s), or other lesion(s) by snare                         technique Diagnosis Code(s):     --- Professional ---                        Z12.11, Encounter for screening for malignant neoplasm                         of colon                        D12.4, Benign neoplasm of descending colon                        D12.2, Benign neoplasm of ascending colon                        D12.8, Benign neoplasm of rectum CPT copyright 2022 American Medical Association. All rights reserved. The codes documented in this report are preliminary and upon coder review may  be revised to meet current compliance requirements. [name], MD [name] MD, MD 02/05/2022 10:45:03 AM This report has been signed electronically. Number of Addenda: 0 Note Initiated On: 02/05/2022 10:17 AM Scope Withdrawal Time: 0 hours 16 minutes 48 seconds  Total Procedure Duration: 0 hours 20 minutes 14 seconds  Estimated Blood Loss:  Estimated blood loss: none.      [facility]

## 2022-02-05 NOTE — Anesthesia Preprocedure Evaluation (Addendum)
Anesthesia Evaluation  Patient identified by MRN, date of birth, ID band Patient awake    Reviewed: Allergy & Precautions, NPO status , Patient's Chart, lab work & pertinent test results  Airway Mallampati: II  TM Distance: >3 FB Neck ROM: full    Dental   Pulmonary asthma ,  Well controlled on daily inhaler   Pulmonary exam normal        Cardiovascular Exercise Tolerance: Good negative cardio ROS Normal cardiovascular exam     Neuro/Psych negative neurological ROS  negative psych ROS   GI/Hepatic negative GI ROS, Neg liver ROS, neg GERD  ,  Endo/Other  negative endocrine ROS  Renal/GU negative Renal ROS  negative genitourinary   Musculoskeletal   Abdominal   Peds  Hematology negative hematology ROS (+)   Anesthesia Other Findings Past Medical History: No date: Asthma  Past Surgical History: No date: ELBOW SURGERY; Left     Comment:  Screws     Reproductive/Obstetrics negative OB ROS                            Anesthesia Physical Anesthesia Plan  ASA: 2  Anesthesia Plan: General   Post-op Pain Management:    Induction: Intravenous  PONV Risk Score and Plan: Propofol infusion and TIVA  Airway Management Planned: Natural Airway and Nasal Cannula  Additional Equipment:   Intra-op Plan:   Post-operative Plan:   Informed Consent: I have reviewed the patients History and Physical, chart, labs and discussed the procedure including the risks, benefits and alternatives for the proposed anesthesia with the patient or authorized representative who has indicated his/her understanding and acceptance.     Dental Advisory Given  Plan Discussed with: Anesthesiologist, CRNA and Surgeon  Anesthesia Plan Comments: (Patient consented for risks of anesthesia including but not limited to:  - adverse reactions to medications - risk of airway placement if required - damage to eyes,  teeth, lips or other oral mucosa - nerve damage due to positioning  - sore throat or hoarseness - Damage to heart, brain, nerves, lungs, other parts of body or loss of life  Patient voiced understanding.)        Anesthesia Quick Evaluation

## 2022-02-06 ENCOUNTER — Encounter: Payer: Self-pay | Admitting: Gastroenterology

## 2022-02-06 LAB — SURGICAL PATHOLOGY

## 2022-02-08 ENCOUNTER — Encounter: Payer: Self-pay | Admitting: Gastroenterology

## 2022-04-19 ENCOUNTER — Encounter: Payer: Self-pay | Admitting: Family Medicine

## 2022-04-22 ENCOUNTER — Other Ambulatory Visit: Payer: Self-pay | Admitting: Family Medicine

## 2022-04-22 ENCOUNTER — Ambulatory Visit (INDEPENDENT_AMBULATORY_CARE_PROVIDER_SITE_OTHER): Payer: BC Managed Care – PPO | Admitting: Family Medicine

## 2022-04-22 ENCOUNTER — Encounter: Payer: Self-pay | Admitting: Family Medicine

## 2022-04-22 ENCOUNTER — Other Ambulatory Visit: Payer: Self-pay

## 2022-04-22 VITALS — BP 150/98 | HR 80 | Temp 99.4°F | Ht 68.0 in | Wt 175.1 lb

## 2022-04-22 DIAGNOSIS — M25512 Pain in left shoulder: Secondary | ICD-10-CM

## 2022-04-22 DIAGNOSIS — R030 Elevated blood-pressure reading, without diagnosis of hypertension: Secondary | ICD-10-CM

## 2022-04-22 DIAGNOSIS — A599 Trichomoniasis, unspecified: Secondary | ICD-10-CM

## 2022-04-22 DIAGNOSIS — Z113 Encounter for screening for infections with a predominantly sexual mode of transmission: Secondary | ICD-10-CM

## 2022-04-22 LAB — MICROSCOPIC EXAMINATION
Bacteria, UA: NONE SEEN
Epithelial Cells (non renal): NONE SEEN /hpf (ref 0–10)

## 2022-04-22 LAB — URINALYSIS, ROUTINE W REFLEX MICROSCOPIC
Bilirubin, UA: NEGATIVE
Glucose, UA: NEGATIVE
Ketones, UA: NEGATIVE
Nitrite, UA: NEGATIVE
Protein,UA: NEGATIVE
Specific Gravity, UA: 1.02 (ref 1.005–1.030)
Urobilinogen, Ur: 0.2 mg/dL (ref 0.2–1.0)
pH, UA: 6 (ref 5.0–7.5)

## 2022-04-22 LAB — MICROALBUMIN, URINE WAIVED
Creatinine, Urine Waived: 200 mg/dL (ref 10–300)
Microalb, Ur Waived: 30 mg/L — ABNORMAL HIGH (ref 0–19)
Microalb/Creat Ratio: <30 mg/g (ref <30)

## 2022-04-22 MED ORDER — AZITHROMYCIN 250 MG PO TABS: 1000.0000 mg | ORAL_TABLET | Freq: Once | ORAL | 0 refills | Status: AC

## 2022-04-22 MED ORDER — CYCLOBENZAPRINE HCL 10 MG PO TABS: 10.0000 mg | ORAL_TABLET | Freq: Every day | ORAL | 0 refills | Status: DC

## 2022-04-22 NOTE — Telephone Encounter (Signed)
Appt- ok to double book today. I do not see his GF so she will need to go to the health department or her doctor.

## 2022-04-22 NOTE — Addendum Note (Signed)
Addended by: Valerie Roys on: 04/22/2022 01:26 PM   Modules accepted: Orders

## 2022-04-22 NOTE — Telephone Encounter (Signed)
4 tabs all at once

## 2022-04-22 NOTE — Telephone Encounter (Signed)
Pt scheduled  

## 2022-04-22 NOTE — Progress Notes (Addendum)
BP (!) 150/98 (BP Location: Left Arm, Cuff Size: Normal)   Pulse 80   Temp 99.4 F (37.4 C) (Oral)   Ht '5\' 8"'$  (1.727 m)   Wt 175 lb 1.6 oz (79.4 kg)   SpO2 98%   BMI 26.62 kg/m    Subjective:    Patient ID: Stephen Mercado, male    DOB: 1976-01-01, 47 y.o.   MRN: [id number]  HPI: Stephen Mercado is a 47 y.o. male  Chief Complaint  Patient presents with   SEXUALLY TRANSMITTED DISEASE   STD SCREENING- found out his partner tested positive for trichomonas. He notes that he had 2 partners and had another partner who hadn't been with in 18 months. She tested positive for trich- so he would like to be treated.  Sexual activity:  Recent unprotected sexual encounter Contraception: no Recent unprotected intercourse: yes History of sexually transmitted diseases: no Previous sexually transmitted disease screening: yes Genital lesions: no Penile discharge: yes Dysuria: yes Swollen lymph nodes: no Fevers: no Rash: no  SHOULDER PAIN Duration: 4 days Involved shoulder: left Mechanism of injury: unknown Location:  deep in his chest Onset:sudden Severity: mild  Quality:  tight and sore Frequency: with movement Radiation: no Aggravating factors: lifting  Alleviating factors: nothing  Status: stable Treatments attempted: none  Relief with NSAIDs?:  No NSAIDs Taken Weakness: no Numbness: no Decreased grip strength: no Redness: no Swelling: no Bruising: no Fevers: no  Relevant past medical, surgical, family and social history reviewed and updated as indicated. Interim medical history since our last visit reviewed. Allergies and medications reviewed and updated.  Review of Systems  Constitutional: Negative.   Respiratory: Negative.    Cardiovascular: Negative.   Gastrointestinal: Negative.   Genitourinary:  Positive for penile discharge. Negative for decreased urine volume, difficulty urinating, dysuria, enuresis, flank pain, frequency, genital sores, hematuria, penile pain,  penile swelling, scrotal swelling, testicular pain and urgency.  Musculoskeletal:  Positive for arthralgias and myalgias. Negative for back pain, gait problem, joint swelling, neck pain and neck stiffness.  Skin: Negative.   Neurological: Negative.   Psychiatric/Behavioral: Negative.      Per HPI unless specifically indicated above     Objective:    BP (!) 150/98 (BP Location: Left Arm, Cuff Size: Normal)   Pulse 80   Temp 99.4 F (37.4 C) (Oral)   Ht '5\' 8"'$  (1.727 m)   Wt 175 lb 1.6 oz (79.4 kg)   SpO2 98%   BMI 26.62 kg/m   Wt Readings from Last 3 Encounters:  04/22/22 175 lb 1.6 oz (79.4 kg)  02/05/22 179 lb 3.7 oz (81.3 kg)  01/04/22 179 lb 4.8 oz (81.3 kg)    Physical Exam Vitals and nursing note reviewed.  Constitutional:      General: He is not in acute distress.    Appearance: Normal appearance. He is not ill-appearing, toxic-appearing or diaphoretic.  HENT:     Head: Normocephalic and atraumatic.     Right Ear: External ear normal.     Left Ear: External ear normal.     Nose: Nose normal.     Mouth/Throat:     Mouth: Mucous membranes are moist.     Pharynx: Oropharynx is clear.  Eyes:     General: No scleral icterus.       Right eye: No discharge.        Left eye: No discharge.     Extraocular Movements: Extraocular movements intact.     Conjunctiva/sclera: Conjunctivae normal.  Pupils: Pupils are equal, round, and reactive to light.  Cardiovascular:     Rate and Rhythm: Normal rate and regular rhythm.     Pulses: Normal pulses.     Heart sounds: Normal heart sounds. No murmur heard.    No friction rub. No gallop.  Pulmonary:     Effort: Pulmonary effort is normal. No respiratory distress.     Breath sounds: Normal breath sounds. No stridor. No wheezing, rhonchi or rales.  Chest:     Chest wall: No tenderness.  Musculoskeletal:        General: Normal range of motion.     Cervical back: Normal range of motion and neck supple.  Skin:    General:  Skin is warm and dry.     Capillary Refill: Capillary refill takes less than 2 seconds.     Coloration: Skin is not jaundiced or pale.     Findings: No bruising, erythema, lesion or rash.  Neurological:     General: No focal deficit present.     Mental Status: He is alert and oriented to person, place, and time. Mental status is at baseline.  Psychiatric:        Mood and Affect: Mood normal.        Behavior: Behavior normal.        Thought Content: Thought content normal.        Judgment: Judgment normal.     Results for orders placed or performed during the hospital encounter of 02/05/22  Surgical pathology  Result Value Ref Range   SURGICAL PATHOLOGY      SURGICAL PATHOLOGY CASE: [id number] PATIENT: Derrell Coburn Surgical Pathology Report     Specimen Submitted: A. Colon polyp, ascending; cold snare B. Colon polyp, descending; cold snare C. Rectum polyp; hot snare  Clinical History: Screening colonoscopy. Colon polyps      DIAGNOSIS: A.  COLON POLYP, ASCENDING; COLD SNARE: - TUBULAR ADENOMA. - NEGATIVE FOR HIGH-GRADE DYSPLASIA AND MALIGNANCY.  B.  COLON POLYP, DESCENDING; COLD SNARE: - FAVOR INFLAMED, EDEMATOUS HYPERPLASTIC POLYP. - NEGATIVE FOR DYSPLASIA AND MALIGNANCY.  C.  RECTUM POLYP; HOT SNARE: - TUBULAR ADENOMA. - INKED CAUTERIZED BASE IS FREE OF DYSPLASIA. - NEGATIVE FOR HIGH-GRADE DYSPLASIA AND MALIGNANCY.  GROSS DESCRIPTION: A. Labeled: Ascending colon polyp cold snare Received: In formalin Collection time: 10:29 AM on 02/05/2022 Placed into formalin time: Not provided Tissue fragment(s): 1 Size: 0.5 cm Description: Tan polypoid fragment Entirely submitted in 1 cassette.  B. Labeled:  Descending colon polyp cold snare Received: In formalin Collection time: 10:33 AM on 02/05/2022 Placed into formalin time: Not provided Tissue fragment(s): 1 Size: 0.4 cm Description: Tan polypoid fragment Entirely submitted in 1 cassette.  C. Labeled:  Rectum polyp hot snare Received: In formalin Collection time: 10:40 AM on 02/05/2022 Placed into formalin time: Not provided Tissue fragment(s): 1 Size: 1.3 x 1 x 0.6 cm Description: Pink-red polypoid fragment, probable base inked, sectioned Entirely submitted in 1 cassette.  BD 02/05/2022  Final Diagnosis performed by Quay Burow, MD.   Electronically signed 02/06/2022 10:15:13AM The electronic signature indicates that the named Attending Pathologist has evaluated the specimen Technical component performed at [facility], [address], [place], [place] [address] Lab: [phone] Dir: Rush Farmer, MD, MMM  Professional component performed at [facility], [facility], [place], [place], [place] [address] Lab: [phone] Dir: Kathi Simpers, MD       Assessment & Plan:   Problem List Items Addressed This Visit  None Visit Diagnoses     Trichomoniasis    -  Primary   Treated today. Await results.   Relevant Medications   azithromycin (ZITHROMAX) 250 MG tablet   Routine screening for STI (sexually transmitted infection)       Labs drawn today. Await results.   Relevant Orders   Urinalysis, Routine w reflex microscopic   GC/Chlamydia Probe Amp   HSV 1 and 2 Ab, IgG   RPR w/reflex to TrepSure   HIV Antibody (routine testing w rflx)   Acute Viral Hepatitis (HAV, HBV, HCV)   Acute pain of left shoulder       Will treat with flexeril and stretches. Call with any concerns or if not getting better.   Elevated blood pressure reading in office without diagnosis of hypertension       Will work on [employer] and recheck in 1 month.   Relevant Orders   Microalbumin, Urine Waived        Follow up plan: Return in 4 weeks (on 05/20/2022), or follow up BP.

## 2022-04-23 LAB — HCV INTERPRETATION

## 2022-04-23 LAB — RPR W/REFLEX TO TREPSURE: RPR: NONREACTIVE

## 2022-04-23 LAB — ACUTE VIRAL HEPATITIS (HAV, HBV, HCV)
HCV Ab: NONREACTIVE
Hep A IgM: NEGATIVE
Hep B C IgM: NEGATIVE
Hepatitis B Surface Ag: NEGATIVE

## 2022-04-23 LAB — HSV 1 AND 2 AB, IGG
HSV 1 Glycoprotein G Ab, IgG: <0.91 index (ref 0.00–0.90)
HSV 2 IgG, Type Spec: <0.91 index (ref 0.00–0.90)

## 2022-04-23 LAB — T PALLIDUM ANTIBODY, EIA: T pallidum Antibody, EIA: NEGATIVE

## 2022-04-23 LAB — HIV ANTIBODY (ROUTINE TESTING W REFLEX): HIV Screen 4th Generation wRfx: NONREACTIVE

## 2022-04-23 NOTE — Telephone Encounter (Signed)
I got a message yesterday that they needed a verbal- has this been taken care of?

## 2022-04-23 NOTE — Telephone Encounter (Signed)
Requested medication (s) are due for refill today - no  Requested medication (s) are on the active medication list -yes  Future visit scheduled -yes  Last refill: 04/22/22  Notes to clinic: Pharmacy request: dose clarification - 2 different sig  Requested Prescriptions  Pending Prescriptions Disp Refills   azithromycin (ZITHROMAX) 250 MG tablet [Pharmacy Med Name: AZITHROMYCIN 250 MG TABLET] 4 tablet 0    Sig: Take 4 tablets (1,000 mg total) by mouth once for 1 dose. Take 2 tablets on day 1, then 1 tablet daily on days 2 through 5     Off-Protocol Failed - 04/22/2022  4:28 PM      Failed - Medication not assigned to a protocol, review manually.      Passed - Valid encounter within last 12 months    Recent Outpatient Visits           Yesterday Trichomoniasis   [place], [place], DO   3 months ago Routine general medical examination at a health care facility   [facility], [place], DO   10 months ago Viral upper respiratory tract infection   [facility] Kathrine Haddock, NP   1 year ago Routine general medical examination at a health care facility   [facility], [place], DO   1 year ago Suspected COVID-19 virus infection   [place], [place], DO       Future Appointments             In 3 weeks Johnson, Megan P, DO [employer], [facility]   In 8 months Johnson, Megan P, DO [place], [facility]               Requested Prescriptions  Pending Prescriptions Disp Refills   azithromycin (ZITHROMAX) 250 MG tablet [Pharmacy Med Name: AZITHROMYCIN 250 MG TABLET] 4 tablet 0    Sig: Take 4 tablets (1,000 mg total) by mouth once for 1 dose. Take 2 tablets on day 1, then 1 tablet daily on days 2 through 5     Off-Protocol Failed - 04/22/2022  4:28 PM      Failed - Medication not assigned to a protocol, review manually.      Passed - Valid encounter within last 12  months    Recent Outpatient Visits           Yesterday Trichomoniasis   [facility], [place], DO   3 months ago Routine general medical examination at a health care facility   [facility], [place], DO   10 months ago Viral upper respiratory tract infection   [facility] Kathrine Haddock, NP   1 year ago Routine general medical examination at a health care facility   [facility], [place], DO   1 year ago Suspected COVID-19 virus infection   [place], [place], DO       Future Appointments             In 3 weeks Wynetta Emery, Barb Merino, DO [employer], [place]   In 8 months Wynetta Emery, Barb Merino, DO [employer], [facility]

## 2022-04-23 NOTE — Telephone Encounter (Signed)
Spoke with patient's pharmacy yesterday and notified the pharmacy tech of Dr.Johnson's recommendations. Pharmacist verbalized understanding.

## 2022-04-26 ENCOUNTER — Encounter: Payer: Self-pay | Admitting: Family Medicine

## 2022-04-26 LAB — GC/CHLAMYDIA PROBE AMP
Chlamydia trachomatis, NAA: NEGATIVE
Neisseria Gonorrhoeae by PCR: NEGATIVE

## 2022-05-20 ENCOUNTER — Ambulatory Visit (INDEPENDENT_AMBULATORY_CARE_PROVIDER_SITE_OTHER): Payer: BC Managed Care – PPO | Admitting: Family Medicine

## 2022-05-20 ENCOUNTER — Encounter: Payer: Self-pay | Admitting: Family Medicine

## 2022-05-20 VITALS — BP 139/94 | HR 80 | Temp 99.0°F | Ht 68.0 in | Wt 175.4 lb

## 2022-05-20 DIAGNOSIS — I10 Essential (primary) hypertension: Secondary | ICD-10-CM | POA: Diagnosis not present

## 2022-05-20 DIAGNOSIS — A599 Trichomoniasis, unspecified: Secondary | ICD-10-CM | POA: Diagnosis not present

## 2022-05-20 MED ORDER — METRONIDAZOLE 500 MG PO TABS: 500.0000 mg | ORAL_TABLET | Freq: Two times a day (BID) | ORAL | 0 refills | Status: DC

## 2022-05-20 NOTE — Progress Notes (Signed)
BP (!) 139/94 (BP Location: Left Arm, Cuff Size: Normal)   Pulse 80   Temp 99 F (37.2 C) (Oral)   Ht 5' 8"$  (1.727 m)   Wt 175 lb 6.4 oz (79.6 kg)   SpO2 98%   BMI 26.67 kg/m    Subjective:    Patient ID: Stephen Mercado, male    DOB: 01/09/1976, 47 y.o.   MRN: [id number]  HPI: Stephen Mercado is a 47 y.o. male  Chief Complaint  Patient presents with   Hypertension   HYPERTENSION  Hypertension status: stable  Satisfied with current treatment? yes Duration of hypertension: chronic BP monitoring frequency:  not checking BP medication side effects:  no Medication compliance: excellent compliance Previous BP meds: none Aspirin: no Recurrent headaches: no Visual changes: no Palpitations: no Dyspnea: no Chest pain: no Lower extremity edema: no Dizzy/lightheaded: no  Continues with irritation during sex.   Relevant past medical, surgical, family and social history reviewed and updated as indicated. Interim medical history since our last visit reviewed. Allergies and medications reviewed and updated.  Review of Systems  Constitutional: Negative.   Respiratory: Negative.    Cardiovascular: Negative.   Gastrointestinal: Negative.   Musculoskeletal: Negative.   Psychiatric/Behavioral: Negative.      Per HPI unless specifically indicated above     Objective:    BP (!) 139/94 (BP Location: Left Arm, Cuff Size: Normal)   Pulse 80   Temp 99 F (37.2 C) (Oral)   Ht 5' 8"$  (1.727 m)   Wt 175 lb 6.4 oz (79.6 kg)   SpO2 98%   BMI 26.67 kg/m   Wt Readings from Last 3 Encounters:  05/20/22 175 lb 6.4 oz (79.6 kg)  04/22/22 175 lb 1.6 oz (79.4 kg)  02/05/22 179 lb 3.7 oz (81.3 kg)    Physical Exam Vitals and nursing note reviewed.  Constitutional:      General: He is not in acute distress.    Appearance: Normal appearance. He is normal weight. He is not ill-appearing, toxic-appearing or diaphoretic.  HENT:     Head: Normocephalic and atraumatic.     Right Ear:  External ear normal.     Left Ear: External ear normal.     Nose: Nose normal.     Mouth/Throat:     Mouth: Mucous membranes are moist.     Pharynx: Oropharynx is clear.  Eyes:     General: No scleral icterus.       Right eye: No discharge.        Left eye: No discharge.     Extraocular Movements: Extraocular movements intact.     Conjunctiva/sclera: Conjunctivae normal.     Pupils: Pupils are equal, round, and reactive to light.  Cardiovascular:     Rate and Rhythm: Normal rate and regular rhythm.     Pulses: Normal pulses.     Heart sounds: Normal heart sounds. No murmur heard.    No friction rub. No gallop.  Pulmonary:     Effort: Pulmonary effort is normal. No respiratory distress.     Breath sounds: Normal breath sounds. No stridor. No wheezing, rhonchi or rales.  Chest:     Chest wall: No tenderness.  Musculoskeletal:        General: Normal range of motion.     Cervical back: Normal range of motion and neck supple.  Skin:    General: Skin is warm and dry.     Capillary Refill: Capillary refill takes less than 2 seconds.  Coloration: Skin is not jaundiced or pale.     Findings: No bruising, erythema, lesion or rash.  Neurological:     General: No focal deficit present.     Mental Status: He is alert and oriented to person, place, and time. Mental status is at baseline.  Psychiatric:        Mood and Affect: Mood normal.        Behavior: Behavior normal.        Thought Content: Thought content normal.        Judgment: Judgment normal.     Results for orders placed or performed in visit on 04/22/22  GC/Chlamydia Probe Amp   Specimen: Urine   UR  Result Value Ref Range   Chlamydia trachomatis, NAA Negative Negative   Neisseria Gonorrhoeae by PCR Negative Negative  Microscopic Examination   Urine  Result Value Ref Range   WBC, UA 0-5 0 - 5 /hpf   RBC, Urine 0-2 0 - 2 /hpf   Epithelial Cells (non renal) None seen 0 - 10 /hpf   Bacteria, UA None seen None  seen/Few  Urinalysis, Routine w reflex microscopic  Result Value Ref Range   Specific Gravity, UA 1.020 1.005 - 1.030   pH, UA 6.0 5.0 - 7.5   Color, UA Yellow Yellow   Appearance Ur Clear Clear   Leukocytes,UA Trace (A) Negative   Protein,UA Negative Negative/Trace   Glucose, UA Negative Negative   Ketones, UA Negative Negative   RBC, UA Trace (A) Negative   Bilirubin, UA Negative Negative   Urobilinogen, Ur 0.2 0.2 - 1.0 mg/dL   Nitrite, UA Negative Negative   Microscopic Examination See below:   HSV 1 and 2 Ab, IgG  Result Value Ref Range   HSV 1 Glycoprotein G Ab, IgG <0.91 0.00 - 0.90 index   HSV 2 IgG, Type Spec <0.91 0.00 - 0.90 index  RPR w/reflex to TrepSure  Result Value Ref Range   RPR Non Reactive Non Reactive  HIV Antibody (routine testing w rflx)  Result Value Ref Range   HIV Screen 4th Generation wRfx Non Reactive Non Reactive  Acute Viral Hepatitis (HAV, HBV, HCV)  Result Value Ref Range   Hep A IgM Negative Negative   Hepatitis B Surface Ag Negative Negative   Hep B C IgM Negative Negative   HCV Ab Non Reactive Non Reactive  Microalbumin, Urine Waived  Result Value Ref Range   Microalb, Ur Waived 30 (H) 0 - 19 mg/L   Creatinine, Urine Waived 200 10 - 300 mg/dL   Microalb/Creat Ratio <30 <30 mg/g  Interpretation:  Result Value Ref Range   HCV Interp 1: Comment   T pallidum Antibody, EIA  Result Value Ref Range   T pallidum Antibody, EIA Negative Negative      Assessment & Plan:   Problem List Items Addressed This Visit       Cardiovascular and Mediastinum   Primary hypertension - Primary    With microalbumin on last check. Declines medication at this time. Will work on [employer]. Continue to monitor.       Other Visit Diagnoses     Trichomoniasis       Will treat with flagyl. Call with any concerns.   Relevant Medications   metroNIDAZOLE (FLAGYL) 500 MG tablet        Follow up plan: Return in about 3 months (around  08/18/2022).

## 2022-05-20 NOTE — Assessment & Plan Note (Signed)
With microalbumin on last check. Declines medication at this time. Will work on [employer]. Continue to monitor.

## 2022-06-03 ENCOUNTER — Encounter: Payer: Self-pay | Admitting: Family Medicine

## 2022-06-04 ENCOUNTER — Encounter: Payer: Self-pay | Admitting: Family Medicine

## 2022-06-04 ENCOUNTER — Telehealth (INDEPENDENT_AMBULATORY_CARE_PROVIDER_SITE_OTHER): Payer: BC Managed Care – PPO | Admitting: Family Medicine

## 2022-06-04 ENCOUNTER — Telehealth: Payer: Self-pay | Admitting: Family Medicine

## 2022-06-04 DIAGNOSIS — K529 Noninfective gastroenteritis and colitis, unspecified: Secondary | ICD-10-CM | POA: Diagnosis not present

## 2022-06-04 MED ORDER — ONDANSETRON 8 MG PO TBDP: 8.0000 mg | ORAL_TABLET | Freq: Three times a day (TID) | ORAL | 2 refills | Status: DC | PRN

## 2022-06-04 NOTE — Telephone Encounter (Signed)
Pt was called and scheduled okay per Dr. Wynetta Emery to scheduled at 11:20

## 2022-06-04 NOTE — Progress Notes (Signed)
There were no vitals taken for this visit.   Subjective:    Patient ID: Stephen Mercado, male    DOB: [birth year], 47 y.o.   MRN: [id number]  HPI: Stephen Mercado is a 47 y.o. male  Chief Complaint  Patient presents with   Diarrhea    Patient says he started having diarrhea yesterday around 3 pm and says he has used the bathroom multiple times. Patient says he feels as if he has to throw up and says it feels like it is sitting at the back of his throat but nothing is coming up. Patient says all he has been able to eat is saltine crackers.    Nausea   GASTROENTERITIS Duration: yesterday PM Diarrhea: yes non-bloody  Episodes of diarrhea/day: 5 Nausea: yes Vomiting: no Episodes of vomit/day: 0 Abdominal pain: yes Fever: no, + chills and sweats Decreased appetite: yes Tolerating liquids: yes Foreign travel: no Relevant dietary history: none Similar illness in contacts: no Recent antibiotic use: no Status: stable Treatments attempted: none  Relevant past medical, surgical, family and social history reviewed and updated as indicated. Interim medical history since our last visit reviewed. Allergies and medications reviewed and updated.  Review of Systems  Constitutional: Negative.   Respiratory: Negative.    Cardiovascular: Negative.   Gastrointestinal:  Positive for diarrhea and nausea. Negative for abdominal distention, abdominal pain, anal bleeding, blood in stool, constipation, rectal pain and vomiting.  Musculoskeletal: Negative.   Skin: Negative.   Psychiatric/Behavioral: Negative.      Per HPI unless specifically indicated above     Objective:    There were no vitals taken for this visit.  Wt Readings from Last 3 Encounters:  05/20/22 175 lb 6.4 oz (79.6 kg)  04/22/22 175 lb 1.6 oz (79.4 kg)  02/05/22 179 lb 3.7 oz (81.3 kg)    Physical Exam Vitals and nursing note reviewed.  Constitutional:      General: He is not in acute distress.    Appearance: Normal  appearance. He is normal weight. He is ill-appearing. He is not toxic-appearing or diaphoretic.  HENT:     Head: Normocephalic and atraumatic.     Right Ear: External ear normal.     Left Ear: External ear normal.     Nose: Nose normal.     Mouth/Throat:     Mouth: Mucous membranes are moist.     Pharynx: Oropharynx is clear.  Eyes:     General: No scleral icterus.       Right eye: No discharge.        Left eye: No discharge.     Conjunctiva/sclera: Conjunctivae normal.     Pupils: Pupils are equal, round, and reactive to light.  Pulmonary:     Effort: Pulmonary effort is normal. No respiratory distress.     Comments: Speaking in full sentences Musculoskeletal:        General: Normal range of motion.     Cervical back: Normal range of motion.  Skin:    Coloration: Skin is not jaundiced or pale.     Findings: No bruising, erythema, lesion or rash.  Neurological:     Mental Status: He is alert and oriented to person, place, and time. Mental status is at baseline.  Psychiatric:        Mood and Affect: Mood normal.        Behavior: Behavior normal.        Thought Content: Thought content normal.  Judgment: Judgment normal.     Results for orders placed or performed in visit on 04/22/22  GC/Chlamydia Probe Amp   Specimen: Urine   UR  Result Value Ref Range   Chlamydia trachomatis, NAA Negative Negative   Neisseria Gonorrhoeae by PCR Negative Negative  Microscopic Examination   Urine  Result Value Ref Range   WBC, UA 0-5 0 - 5 /hpf   RBC, Urine 0-2 0 - 2 /hpf   Epithelial Cells (non renal) None seen 0 - 10 /hpf   Bacteria, UA None seen None seen/Few  Urinalysis, Routine w reflex microscopic  Result Value Ref Range   Specific Gravity, UA 1.020 1.005 - 1.030   pH, UA 6.0 5.0 - 7.5   Color, UA Yellow Yellow   Appearance Ur Clear Clear   Leukocytes,UA Trace (A) Negative   Protein,UA Negative Negative/Trace   Glucose, UA Negative Negative   Ketones, UA Negative  Negative   RBC, UA Trace (A) Negative   Bilirubin, UA Negative Negative   Urobilinogen, Ur 0.2 0.2 - 1.0 mg/dL   Nitrite, UA Negative Negative   Microscopic Examination See below:   HSV 1 and 2 Ab, IgG  Result Value Ref Range   HSV 1 Glycoprotein G Ab, IgG <0.91 0.00 - 0.90 index   HSV 2 IgG, Type Spec <0.91 0.00 - 0.90 index  RPR w/reflex to TrepSure  Result Value Ref Range   RPR Non Reactive Non Reactive  HIV Antibody (routine testing w rflx)  Result Value Ref Range   HIV Screen 4th Generation wRfx Non Reactive Non Reactive  Acute Viral Hepatitis (HAV, HBV, HCV)  Result Value Ref Range   Hep A IgM Negative Negative   Hepatitis B Surface Ag Negative Negative   Hep B C IgM Negative Negative   HCV Ab Non Reactive Non Reactive  Microalbumin, Urine Waived  Result Value Ref Range   Microalb, Ur Waived 30 (H) 0 - 19 mg/L   Creatinine, Urine Waived 200 10 - 300 mg/dL   Microalb/Creat Ratio <30 <30 mg/g  Interpretation:  Result Value Ref Range   HCV Interp 1: Comment   T pallidum Antibody, EIA  Result Value Ref Range   T pallidum Antibody, EIA Negative Negative      Assessment & Plan:   Problem List Items Addressed This Visit   None Visit Diagnoses     Gastroenteritis    -  Primary   Rest, fluids. Gentle diet. Symptomatic care with zofran. Call if not getting better in the next couple of days. Continue to monitor.        Follow up plan: Return if symptoms worsen or fail to improve.    This visit was completed via video visit through MyChart due to the restrictions of the COVID-19 pandemic. All issues as above were discussed and addressed. Physical exam was done as above through visual confirmation on video through MyChart. If it was felt that the patient should be evaluated in the office, they were directed there. The patient verbally consented to this visit. Location of the patient: home Location of the provider: work Those involved with this call:  Provider: Park Liter, DO CMA: Irena Reichmann, [place] Desk/Registration: Leota Jacobsen  Time spent on call:  15 minutes with patient face to face via video conference. More than 50% of this time was spent in counseling and coordination of care. 23 minutes total spent in review of patient's record and preparation of their chart.

## 2022-06-20 ENCOUNTER — Encounter: Payer: Self-pay | Admitting: Family Medicine

## 2022-07-10 ENCOUNTER — Other Ambulatory Visit: Payer: Self-pay | Admitting: Family Medicine

## 2022-07-10 NOTE — Telephone Encounter (Signed)
Requested medication (s) are due for refill today: yes  Requested medication (s) are on the active medication list: yes  Last refill:  04/22/22 #30/0  Future visit scheduled: yes  Notes to clinic:  Unable to refill per protocol, cannot delegate.    Requested Prescriptions  Pending Prescriptions Disp Refills   cyclobenzaprine (FLEXERIL) 10 MG tablet [Pharmacy Med Name: CYCLOBENZAPRINE 10 MG TABLET] 30 tablet 0    Sig: TAKE 1 TABLET BY MOUTH EVERYDAY AT BEDTIME     Not Delegated - Analgesics:  Muscle Relaxants Failed - 07/10/2022  3:10 PM      Failed - This refill cannot be delegated      Passed - Valid encounter within last 6 months    Recent Outpatient Visits           1 month ago [place], [place], DO   1 month ago Primary hypertension   [place], [place], DO   2 months ago Trichomoniasis   [place], [place], DO   6 months ago Routine general medical examination at a health care facility   [place], [place], DO   1 year ago Viral upper respiratory tract infection   [place], NP       Future Appointments             In 6 months Wynetta Emery, Barb Merino, DO [place], [facility]

## 2022-10-01 ENCOUNTER — Other Ambulatory Visit: Payer: Self-pay | Admitting: Family Medicine

## 2022-10-01 NOTE — Telephone Encounter (Signed)
Requested Prescriptions  Pending Prescriptions Disp Refills   albuterol (VENTOLIN HFA) 108 (90 Base) MCG/ACT inhaler [Pharmacy Med Name: ALBUTEROL HFA (PROAIR) INHALER] 8.5 each 2    Sig: TAKE 2 PUFFS BY MOUTH EVERY 6 HOURS AS NEEDED FOR WHEEZE OR SHORTNESS OF BREATH     Pulmonology:  Beta Agonists 2 Failed - 10/01/2022  2:25 AM      Failed - Last BP in normal range    BP Readings from Last 1 Encounters:  05/20/22 (!) 139/94         Passed - Last Heart Rate in normal range    Pulse Readings from Last 1 Encounters:  05/20/22 80         Passed - Valid encounter within last 12 months    Recent Outpatient Visits           3 months ago Gastroenteritis   [place] [facility] [place], [place], DO   4 months ago Primary hypertension   [place] [facility] [place], [place], DO   5 months ago Trichomoniasis   [place] [facility] [place], [place], DO   9 months ago Routine general medical examination at a health care facility   [facility] [place], [place], DO   1 year ago Viral upper respiratory tract infection   [facility] [place] Cirri, NP       Future Appointments             In 3 months Laural Benes, Oralia Rud, DO [place] [facility], [facility]

## 2022-12-25 ENCOUNTER — Encounter: Payer: Self-pay | Admitting: Pharmacist

## 2023-01-06 ENCOUNTER — Ambulatory Visit (INDEPENDENT_AMBULATORY_CARE_PROVIDER_SITE_OTHER): Payer: BC Managed Care – PPO | Admitting: Family Medicine

## 2023-01-06 ENCOUNTER — Encounter: Payer: Self-pay | Admitting: Family Medicine

## 2023-01-06 VITALS — BP 108/68 | HR 69 | Temp 98.3°F | Ht 68.0 in | Wt 175.2 lb

## 2023-01-06 DIAGNOSIS — Z0000 Encounter for general adult medical examination without abnormal findings: Secondary | ICD-10-CM

## 2023-01-06 DIAGNOSIS — Z113 Encounter for screening for infections with a predominantly sexual mode of transmission: Secondary | ICD-10-CM

## 2023-01-06 DIAGNOSIS — R8281 Pyuria: Secondary | ICD-10-CM

## 2023-01-06 DIAGNOSIS — I10 Essential (primary) hypertension: Secondary | ICD-10-CM

## 2023-01-06 LAB — URINALYSIS, ROUTINE W REFLEX MICROSCOPIC
Bilirubin, UA: NEGATIVE
Glucose, UA: NEGATIVE
Ketones, UA: NEGATIVE
Nitrite, UA: NEGATIVE
Protein,UA: NEGATIVE
RBC, UA: NEGATIVE
Specific Gravity, UA: >1.03 — ABNORMAL HIGH (ref 1.005–1.030)
Urobilinogen, Ur: 0.2 mg/dL (ref 0.2–1.0)
pH, UA: 5.5 (ref 5.0–7.5)

## 2023-01-06 LAB — MICROSCOPIC EXAMINATION
Bacteria, UA: NONE SEEN
Epithelial Cells (non renal): NONE SEEN /[HPF] (ref 0–10)
WBC, UA: NONE SEEN /[HPF] (ref 0–5)

## 2023-01-06 LAB — MICROALBUMIN, URINE WAIVED
Creatinine, Urine Waived: 300 mg/dL (ref 10–300)
Microalb, Ur Waived: 30 mg/L — ABNORMAL HIGH (ref 0–19)
Microalb/Creat Ratio: <30 mg/g (ref <30)

## 2023-01-06 MED ORDER — ALBUTEROL SULFATE HFA 108 (90 BASE) MCG/ACT IN AERS: INHALATION_SPRAY | RESPIRATORY_TRACT | 2 refills | Status: DC

## 2023-01-06 NOTE — Assessment & Plan Note (Signed)
Better on recheck. Does not want to start medicine. Discussed the dangers of uncontrolled blood pressure. Will work on [employer] and recheck in 3 months.

## 2023-01-06 NOTE — Addendum Note (Signed)
Addended by: Dorcas Carrow on: 01/06/2023 12:06 PM   Modules accepted: Orders

## 2023-01-06 NOTE — Progress Notes (Signed)
BP 108/68   Pulse 69   Temp 98.3 F (36.8 C) (Oral)   Ht 5\' 8"  (1.727 m)   Wt 175 lb 3.2 oz (79.5 kg)   SpO2 96%   BMI 26.64 kg/m    Subjective:    Patient ID: Stephen Mercado, male    DOB: 05/05/1975, 47 y.o.   MRN: [id number]  HPI: Stephen Mercado is a 47 y.o. male presenting on 01/06/2023 for comprehensive medical examination. Current medical complaints include:  HYPERTENSION  Hypertension status: uncontrolled  Satisfied with current treatment? yes Duration of hypertension: months BP monitoring frequency:  not checking BP medication side effects:  N/A Previous BP meds: none Aspirin: no Recurrent headaches: no Visual changes: no Palpitations: no Dyspnea: no Chest pain: no Lower extremity edema: no Dizzy/lightheaded: no  Interim Problems from his last visit: no  Depression Screen done today and results listed below:     01/06/2023   10:43 AM 05/20/2022    3:38 PM 04/22/2022    1:08 PM 01/04/2022   10:32 AM 11/06/2020    9:37 AM  Depression screen PHQ 2/9  Decreased Interest 0 0 0 0 0  Down, Depressed, Hopeless 0 0 0 0 1  PHQ - 2 Score 0 0 0 0 1  Altered sleeping 0 0 0 0   Tired, decreased energy 0 0 0 0   Change in appetite 0 0 0 0   Feeling bad or failure about yourself  0 0 0 0   Trouble concentrating 0 0 0 0   Moving slowly or fidgety/restless 0 0 0 0   Suicidal thoughts 0 0 0 0   PHQ-9 Score 0 0 0 0   Difficult doing work/chores Not difficult at all Not difficult at all Not difficult at all Not difficult at all     Past Medical History:  Past Medical History:  Diagnosis Date   Asthma     Surgical History:  Past Surgical History:  Procedure Laterality Date   COLONOSCOPY WITH PROPOFOL N/A 02/05/2022   Procedure: COLONOSCOPY WITH PROPOFOL;  Surgeon: Wyline Mood, MD;  [facility] ENDOSCOPY;  Service: Gastroenterology;  Laterality: N/A;   ELBOW SURGERY Left    Screws    Medications:  No current outpatient medications on file prior to visit.   No  current facility-administered medications on file prior to visit.    Allergies:  No Known Allergies  Social History:  Social History   Socioeconomic History   Marital status: Divorced    Spouse name: Not on file   Number of children: Not on file   Years of education: Not on file   Highest education level: Not on file  Occupational History   Not on file  Tobacco Use   Smoking status: Never   Smokeless tobacco: Never  Vaping Use   Vaping status: Never Used  Substance and Sexual Activity   Alcohol use: Yes    Comment: On occasion,none last 24hrs   Drug use: No   Sexual activity: Yes  Other Topics Concern   Not on file  Social History Narrative   Not on file   Social Determinants of Health   Financial Resource Strain: Not on file  Food Insecurity: Not on file  Transportation Needs: Not on file  Physical Activity: Not on file  Stress: Not on file  Social Connections: Not on file  Intimate Partner Violence: Not on file   Social History   Tobacco Use  Smoking Status Never  Smokeless Tobacco  Never   Social History   Substance and Sexual Activity  Alcohol Use Yes   Comment: On occasion,none last 24hrs    Family History:  Family History  Problem Relation Age of Onset   Anuerysm Mother    Hypertension Maternal Grandfather    Asthma Daughter    Cancer Maternal Aunt     Past medical history, surgical history, medications, allergies, family history and social history reviewed with patient today and changes made to appropriate areas of the chart.   Review of Systems  Constitutional: Negative.   HENT: Negative.    Eyes: Negative.   Respiratory: Negative.    Cardiovascular: Negative.   Gastrointestinal: Negative.   Genitourinary: Negative.   Musculoskeletal: Negative.   Skin: Negative.   Neurological: Negative.   Endo/Heme/Allergies: Negative.   Psychiatric/Behavioral: Negative.     All other ROS negative except what is listed above and in the HPI.       Objective:    BP 108/68   Pulse 69   Temp 98.3 F (36.8 C) (Oral)   Ht 5\' 8"  (1.727 m)   Wt 175 lb 3.2 oz (79.5 kg)   SpO2 96%   BMI 26.64 kg/m   Wt Readings from Last 3 Encounters:  01/06/23 175 lb 3.2 oz (79.5 kg)  05/20/22 175 lb 6.4 oz (79.6 kg)  04/22/22 175 lb 1.6 oz (79.4 kg)    Physical Exam Vitals and nursing note reviewed.  Constitutional:      General: He is not in acute distress.    Appearance: Normal appearance. He is obese. He is not ill-appearing, toxic-appearing or diaphoretic.  HENT:     Head: Normocephalic and atraumatic.     Right Ear: Tympanic membrane, ear canal and external ear normal. There is no impacted cerumen.     Left Ear: Tympanic membrane, ear canal and external ear normal. There is no impacted cerumen.     Nose: Nose normal. No congestion or rhinorrhea.     Mouth/Throat:     Mouth: Mucous membranes are moist.     Pharynx: Oropharynx is clear. No oropharyngeal exudate or posterior oropharyngeal erythema.  Eyes:     General: No scleral icterus.       Right eye: No discharge.        Left eye: No discharge.     Extraocular Movements: Extraocular movements intact.     Conjunctiva/sclera: Conjunctivae normal.     Pupils: Pupils are equal, round, and reactive to light.  Neck:     Vascular: No carotid bruit.  Cardiovascular:     Rate and Rhythm: Normal rate and regular rhythm.     Pulses: Normal pulses.     Heart sounds: No murmur heard.    No friction rub. No gallop.  Pulmonary:     Effort: Pulmonary effort is normal. No respiratory distress.     Breath sounds: Normal breath sounds. No stridor. No wheezing, rhonchi or rales.  Chest:     Chest wall: No tenderness.  Abdominal:     General: Abdomen is flat. Bowel sounds are normal. There is no distension.     Palpations: Abdomen is soft. There is no mass.     Tenderness: There is no abdominal tenderness. There is no right CVA tenderness, left CVA tenderness, guarding or rebound.     Hernia:  No hernia is present.  Genitourinary:    Comments: Genital exam deferred with shared decision making Musculoskeletal:        General: No swelling,  tenderness, deformity or signs of injury.     Cervical back: Normal range of motion and neck supple. No rigidity. No muscular tenderness.     Right lower leg: No edema.     Left lower leg: No edema.  Lymphadenopathy:     Cervical: No cervical adenopathy.  Skin:    General: Skin is warm and dry.     Capillary Refill: Capillary refill takes less than 2 seconds.     Coloration: Skin is not jaundiced or pale.     Findings: No bruising, erythema, lesion or rash.  Neurological:     General: No focal deficit present.     Mental Status: He is alert and oriented to person, place, and time.     Cranial Nerves: No cranial nerve deficit.     Sensory: No sensory deficit.     Motor: No weakness.     Coordination: Coordination normal.     Gait: Gait normal.     Deep Tendon Reflexes: Reflexes normal.  Psychiatric:        Mood and Affect: Mood normal.        Behavior: Behavior normal.        Thought Content: Thought content normal.        Judgment: Judgment normal.     Results for orders placed or performed in visit on 04/22/22  GC/Chlamydia Probe Amp   Specimen: Urine   UR  Result Value Ref Range   Chlamydia trachomatis, NAA Negative Negative   Neisseria Gonorrhoeae by PCR Negative Negative  Microscopic Examination   Urine  Result Value Ref Range   WBC, UA 0-5 0 - 5 /hpf   RBC, Urine 0-2 0 - 2 /hpf   Epithelial Cells (non renal) None seen 0 - 10 /hpf   Bacteria, UA None seen None seen/Few  Urinalysis, Routine w reflex microscopic  Result Value Ref Range   Specific Gravity, UA 1.020 1.005 - 1.030   pH, UA 6.0 5.0 - 7.5   Color, UA Yellow Yellow   Appearance Ur Clear Clear   Leukocytes,UA Trace (A) Negative   Protein,UA Negative Negative/Trace   Glucose, UA Negative Negative   Ketones, UA Negative Negative   RBC, UA Trace (A) Negative    Bilirubin, UA Negative Negative   Urobilinogen, Ur 0.2 0.2 - 1.0 mg/dL   Nitrite, UA Negative Negative   Microscopic Examination See below:   HSV 1 and 2 Ab, IgG  Result Value Ref Range   HSV 1 Glycoprotein G Ab, IgG <0.91 0.00 - 0.90 index   HSV 2 IgG, Type Spec <0.91 0.00 - 0.90 index  RPR w/reflex to TrepSure  Result Value Ref Range   RPR Non Reactive Non Reactive  HIV Antibody (routine testing w rflx)  Result Value Ref Range   HIV Screen 4th Generation wRfx Non Reactive Non Reactive  Acute Viral Hepatitis (HAV, HBV, HCV)  Result Value Ref Range   Hep A IgM Negative Negative   Hepatitis B Surface Ag Negative Negative   Hep B C IgM Negative Negative   HCV Ab Non Reactive Non Reactive  Microalbumin, Urine Waived  Result Value Ref Range   Microalb, Ur Waived 30 (H) 0 - 19 mg/L   Creatinine, Urine Waived 200 10 - 300 mg/dL   Microalb/Creat Ratio <30 <30 mg/g  Interpretation:  Result Value Ref Range   HCV Interp 1: Comment   T pallidum Antibody, EIA  Result Value Ref Range   T pallidum Antibody, EIA Negative  Negative      Assessment & Plan:   Problem List Items Addressed This Visit       Cardiovascular and Mediastinum   Primary hypertension    Better on recheck. Does not want to start medicine. Discussed the dangers of uncontrolled blood pressure. Will work on [employer] and recheck in 3 months.       Relevant Orders   Microalbumin, Urine Waived   Other Visit Diagnoses     Routine general medical examination at a health care facility    -  Primary   Vaccines up to date. Screening labs checked today. Colonoscopy up to date. Continue diet and exercise. Call with any concerns.   Relevant Orders   Comprehensive metabolic panel   CBC with Differential/Platelet   Lipid Panel w/o Chol/HDL Ratio   PSA   TSH   Urinalysis, Routine w reflex microscopic   Screening for STD (sexually transmitted disease)       Labs drawn today. Await results.   Relevant Orders   HIV  Antibody (routine testing w rflx)   GC/Chlamydia Probe Amp   RPR w/reflex to TrepSure   Acute Viral Hepatitis (HAV, HBV, HCV)   HSV(herpes simplex vrs) 1+2 ab-IgG   Pyuria       Will send for culture.   Relevant Orders   Urine Culture        LABORATORY TESTING:  Health maintenance labs ordered today as discussed above.   The natural history of prostate cancer and ongoing controversy regarding screening and potential treatment outcomes of prostate cancer has been discussed with the patient. The meaning of a false positive PSA and a false negative PSA has been discussed. He indicates understanding of the limitations of this screening test and wishes to proceed with screening PSA testing.   IMMUNIZATIONS:   - Tdap: Tetanus vaccination status reviewed: last tetanus booster within 10 years. - Influenza: Refused - Pneumovax: Not applicable - Prevnar: Not applicable - COVID: Refused - HPV: Refused - Shingrix vaccine: Not applicable  SCREENING: - Colonoscopy: Up to date  Discussed with patient purpose of the colonoscopy is to detect colon cancer at curable precancerous or early stages    PATIENT COUNSELING:    Sexuality: Discussed sexually transmitted diseases, partner selection, use of condoms, avoidance of unintended pregnancy  and contraceptive alternatives.   Advised to avoid cigarette smoking.  I discussed with the patient that most people either abstain from alcohol or drink within safe limits (<=14/week and <=4 drinks/occasion for males, <=7/weeks and <= 3 drinks/occasion for females) and that the risk for alcohol disorders and other health effects rises proportionally with the number of drinks per week and how often a drinker exceeds daily limits.  Discussed cessation/primary prevention of drug use and availability of treatment for abuse.   Diet: Encouraged to adjust caloric intake to maintain  or achieve ideal body weight, to reduce intake of dietary saturated fat and  total fat, to limit sodium intake by avoiding high sodium foods and not adding table salt, and to maintain adequate dietary potassium and calcium preferably from fresh fruits, vegetables, and low-fat dairy products.    stressed the importance of regular exercise  Injury prevention: Discussed safety belts, safety helmets, smoke detector, smoking near bedding or upholstery.   Dental health: Discussed importance of regular tooth brushing, flossing, and dental visits.   Follow up plan: NEXT PREVENTATIVE PHYSICAL DUE IN 1 YEAR. Return in about 3 months (around 04/07/2023).

## 2023-01-07 ENCOUNTER — Encounter: Payer: Self-pay | Admitting: Family Medicine

## 2023-01-07 LAB — RPR W/REFLEX TO TREPSURE: RPR: NONREACTIVE

## 2023-01-07 LAB — COMPREHENSIVE METABOLIC PANEL
ALT: 35 [IU]/L (ref 0–44)
AST: 16 [IU]/L (ref 0–40)
Albumin: 4.3 g/dL (ref 4.1–5.1)
Alkaline Phosphatase: 56 [IU]/L (ref 44–121)
BUN/Creatinine Ratio: 15 (ref 9–20)
BUN: 17 mg/dL (ref 6–24)
Bilirubin Total: 0.5 mg/dL (ref 0.0–1.2)
CO2: 25 mmol/L (ref 20–29)
Calcium: 9.2 mg/dL (ref 8.7–10.2)
Chloride: 103 mmol/L (ref 96–106)
Creatinine, Ser: 1.11 mg/dL (ref 0.76–1.27)
Globulin, Total: 3.1 g/dL (ref 1.5–4.5)
Glucose: 102 mg/dL — ABNORMAL HIGH (ref 70–99)
Potassium: 3.9 mmol/L (ref 3.5–5.2)
Sodium: 142 mmol/L (ref 134–144)
Total Protein: 7.4 g/dL (ref 6.0–8.5)
eGFR: 81 mL/min/{1.73_m2} (ref >59)

## 2023-01-07 LAB — PSA: Prostate Specific Ag, Serum: 3.5 ng/mL (ref 0.0–4.0)

## 2023-01-07 LAB — CBC WITH DIFFERENTIAL/PLATELET
Basophils Absolute: 0 10*3/uL (ref 0.0–0.2)
Basos: 0 %
EOS (ABSOLUTE): 0.2 10*3/uL (ref 0.0–0.4)
Eos: 3 %
Hematocrit: 52.8 % — ABNORMAL HIGH (ref 37.5–51.0)
Hemoglobin: 16.5 g/dL (ref 13.0–17.7)
Immature Grans (Abs): 0 10*3/uL (ref 0.0–0.1)
Immature Granulocytes: 0 %
Lymphocytes Absolute: 1.9 10*3/uL (ref 0.7–3.1)
Lymphs: 40 %
MCH: 27.7 pg (ref 26.6–33.0)
MCHC: 31.3 g/dL — ABNORMAL LOW (ref 31.5–35.7)
MCV: 89 fL (ref 79–97)
Monocytes Absolute: 0.6 10*3/uL (ref 0.1–0.9)
Monocytes: 11 %
Neutrophils Absolute: 2.2 10*3/uL (ref 1.4–7.0)
Neutrophils: 46 %
Platelets: 191 10*3/uL (ref 150–450)
RBC: 5.96 x10E6/uL — ABNORMAL HIGH (ref 4.14–5.80)
RDW: 12.5 % (ref 11.6–15.4)
WBC: 4.9 10*3/uL (ref 3.4–10.8)

## 2023-01-07 LAB — LIPID PANEL W/O CHOL/HDL RATIO
Cholesterol, Total: 163 mg/dL (ref 100–199)
HDL: 50 mg/dL (ref >39)
LDL Chol Calc (NIH): 95 mg/dL (ref 0–99)
Triglycerides: 97 mg/dL (ref 0–149)
VLDL Cholesterol Cal: 18 mg/dL (ref 5–40)

## 2023-01-07 LAB — ACUTE VIRAL HEPATITIS (HAV, HBV, HCV)
HCV Ab: NONREACTIVE
Hep A IgM: NEGATIVE
Hep B C IgM: NEGATIVE
Hepatitis B Surface Ag: NEGATIVE

## 2023-01-07 LAB — HSV 1 AND 2 AB, IGG
HSV 1 Glycoprotein G Ab, IgG: <0.91 {index} (ref 0.00–0.90)
HSV 2 IgG, Type Spec: <0.91 {index} (ref 0.00–0.90)

## 2023-01-07 LAB — HIV ANTIBODY (ROUTINE TESTING W REFLEX): HIV Screen 4th Generation wRfx: NONREACTIVE

## 2023-01-07 LAB — HCV INTERPRETATION

## 2023-01-07 LAB — TSH: TSH: 1.04 u[IU]/mL (ref 0.450–4.500)

## 2023-01-07 LAB — TREPONEMAL ANTIBODIES, TPPA: Treponemal Antibodies, TPPA: NONREACTIVE

## 2023-01-08 LAB — URINE CULTURE: Organism ID, Bacteria: NO GROWTH

## 2023-01-08 LAB — GC/CHLAMYDIA PROBE AMP
Chlamydia trachomatis, NAA: NEGATIVE
Neisseria Gonorrhoeae by PCR: NEGATIVE

## 2023-04-07 ENCOUNTER — Ambulatory Visit (INDEPENDENT_AMBULATORY_CARE_PROVIDER_SITE_OTHER): Payer: BC Managed Care – PPO | Admitting: Family Medicine

## 2023-04-07 ENCOUNTER — Encounter: Payer: Self-pay | Admitting: Family Medicine

## 2023-04-07 VITALS — BP 120/76 | HR 97 | Wt 180.4 lb

## 2023-04-07 DIAGNOSIS — I10 Essential (primary) hypertension: Secondary | ICD-10-CM

## 2023-04-07 MED ORDER — SILDENAFIL CITRATE 100 MG PO TABS: 50.0000 mg | ORAL_TABLET | Freq: Every day | ORAL | 11 refills | Status: AC | PRN

## 2023-04-07 NOTE — Progress Notes (Signed)
BP 120/76   Pulse 97   Wt 180 lb 6.4 oz (81.8 kg)   SpO2 96%   BMI 27.43 kg/m    Subjective:    Patient ID: Stephen Mercado, male    DOB: [birth year], 47 y.o.   MRN: [id number]  HPI: Stephen Mercado is a 47 y.o. male  Chief Complaint  Patient presents with   Hypertension   HYPERTENSION  Hypertension status: stable  Satisfied with current treatment? yes Duration of hypertension: chronic BP monitoring frequency:  not checking BP medication side effects:  N/A Medication compliance: none Previous BP meds: none Aspirin: no Recurrent headaches: no Visual changes: no Palpitations: no Dyspnea: no Chest pain: no Lower extremity edema: no Dizzy/lightheaded: no  Relevant past medical, surgical, family and social history reviewed and updated as indicated. Interim medical history since our last visit reviewed. Allergies and medications reviewed and updated.  Review of Systems  Constitutional: Negative.   Respiratory: Negative.    Cardiovascular: Negative.   Gastrointestinal: Negative.   Musculoskeletal: Negative.   Psychiatric/Behavioral: Negative.      Per HPI unless specifically indicated above     Objective:    BP 120/76   Pulse 97   Wt 180 lb 6.4 oz (81.8 kg)   SpO2 96%   BMI 27.43 kg/m   Wt Readings from Last 3 Encounters:  04/07/23 180 lb 6.4 oz (81.8 kg)  01/06/23 175 lb 3.2 oz (79.5 kg)  05/20/22 175 lb 6.4 oz (79.6 kg)    Physical Exam Vitals and nursing note reviewed.  Constitutional:      General: He is not in acute distress.    Appearance: Normal appearance. He is not ill-appearing, toxic-appearing or diaphoretic.  HENT:     Head: Normocephalic and atraumatic.     Right Ear: External ear normal.     Left Ear: External ear normal.     Nose: Nose normal.     Mouth/Throat:     Mouth: Mucous membranes are moist.     Pharynx: Oropharynx is clear.  Eyes:     General: No scleral icterus.       Right eye: No discharge.        Left eye: No discharge.      Extraocular Movements: Extraocular movements intact.     Conjunctiva/sclera: Conjunctivae normal.     Pupils: Pupils are equal, round, and reactive to light.  Cardiovascular:     Rate and Rhythm: Normal rate and regular rhythm.     Pulses: Normal pulses.     Heart sounds: Normal heart sounds. No murmur heard.    No friction rub. No gallop.  Pulmonary:     Effort: Pulmonary effort is normal. No respiratory distress.     Breath sounds: Normal breath sounds. No stridor. No wheezing, rhonchi or rales.  Chest:     Chest wall: No tenderness.  Musculoskeletal:        General: Normal range of motion.     Cervical back: Normal range of motion and neck supple.  Skin:    General: Skin is warm and dry.     Capillary Refill: Capillary refill takes less than 2 seconds.     Coloration: Skin is not jaundiced or pale.     Findings: No bruising, erythema, lesion or rash.  Neurological:     General: No focal deficit present.     Mental Status: He is alert and oriented to person, place, and time. Mental status is at baseline.  Psychiatric:  Mood and Affect: Mood normal.        Behavior: Behavior normal.        Thought Content: Thought content normal.        Judgment: Judgment normal.     Results for orders placed or performed in visit on 01/06/23  Microscopic Examination   Collection Time: 01/06/23 10:46 AM   Urine  Result Value Ref Range   WBC, UA None seen 0 - 5 /hpf   RBC, Urine 0-2 0 - 2 /hpf   Epithelial Cells (non renal) None seen 0 - 10 /hpf   Bacteria, UA None seen None seen/Few  Urinalysis, Routine w reflex microscopic   Collection Time: 01/06/23 10:46 AM  Result Value Ref Range   Specific Gravity, UA >1.030 (H) 1.005 - 1.030   pH, UA 5.5 5.0 - 7.5   Color, UA Yellow Yellow   Appearance Ur Clear Clear   Leukocytes,UA Trace (A) Negative   Protein,UA Negative Negative/Trace   Glucose, UA Negative Negative   Ketones, UA Negative Negative   RBC, UA Negative Negative    Bilirubin, UA Negative Negative   Urobilinogen, Ur 0.2 0.2 - 1.0 mg/dL   Nitrite, UA Negative Negative   Microscopic Examination See below:   Microalbumin, Urine Waived   Collection Time: 01/06/23 10:46 AM  Result Value Ref Range   Microalb, Ur Waived 30 (H) 0 - 19 mg/L   Creatinine, Urine Waived 300 10 - 300 mg/dL   Microalb/Creat Ratio <30 <30 mg/g  Comprehensive metabolic panel   Collection Time: 01/06/23 10:47 AM  Result Value Ref Range   Glucose 102 (H) 70 - 99 mg/dL   BUN 17 6 - 24 mg/dL   Creatinine, Ser 1.61 0.76 - 1.27 mg/dL   eGFR 81 >09 UE/AVW/0.98   BUN/Creatinine Ratio 15 9 - 20   Sodium 142 134 - 144 mmol/L   Potassium 3.9 3.5 - 5.2 mmol/L   Chloride 103 96 - 106 mmol/L   CO2 25 20 - 29 mmol/L   Calcium 9.2 8.7 - 10.2 mg/dL   Total Protein 7.4 6.0 - 8.5 g/dL   Albumin 4.3 4.1 - 5.1 g/dL   Globulin, Total 3.1 1.5 - 4.5 g/dL   Bilirubin Total 0.5 0.0 - 1.2 mg/dL   Alkaline Phosphatase 56 44 - 121 IU/L   AST 16 0 - 40 IU/L   ALT 35 0 - 44 IU/L  CBC with Differential/Platelet   Collection Time: 01/06/23 10:47 AM  Result Value Ref Range   WBC 4.9 3.4 - 10.8 x10E3/uL   RBC 5.96 (H) 4.14 - 5.80 x10E6/uL   Hemoglobin 16.5 13.0 - 17.7 g/dL   Hematocrit 11.9 (H) 14.7 - 51.0 %   MCV 89 79 - 97 fL   MCH 27.7 26.6 - 33.0 pg   MCHC 31.3 (L) 31.5 - 35.7 g/dL   RDW 82.9 56.2 - 13.0 %   Platelets 191 150 - 450 x10E3/uL   Neutrophils 46 Not Estab. %   Lymphs 40 Not Estab. %   Monocytes 11 Not Estab. %   Eos 3 Not Estab. %   Basos 0 Not Estab. %   Neutrophils Absolute 2.2 1.4 - 7.0 x10E3/uL   Lymphocytes Absolute 1.9 0.7 - 3.1 x10E3/uL   Monocytes Absolute 0.6 0.1 - 0.9 x10E3/uL   EOS (ABSOLUTE) 0.2 0.0 - 0.4 x10E3/uL   Basophils Absolute 0.0 0.0 - 0.2 x10E3/uL   Immature Granulocytes 0 Not Estab. %   Immature Grans (Abs) 0.0 0.0 -  0.1 x10E3/uL  Lipid Panel w/o Chol/HDL Ratio   Collection Time: 2023-01-19 10:47 AM  Result Value Ref Range   Cholesterol, Total 163 100  - 199 mg/dL   Triglycerides 97 0 - 149 mg/dL   HDL 50 >43 mg/dL   VLDL Cholesterol Cal 18 5 - 40 mg/dL   LDL Chol Calc (NIH) 95 0 - 99 mg/dL  PSA   Collection Time: 2023/01/19 10:47 AM  Result Value Ref Range   Prostate Specific Ag, Serum 3.5 0.0 - 4.0 ng/mL  TSH   Collection Time: January 19, 2023 10:47 AM  Result Value Ref Range   TSH 1.040 0.450 - 4.500 uIU/mL  GC/Chlamydia Probe Amp   Collection Time: 19-Jan-2023 11:03 AM   Specimen: Urine   UR  Result Value Ref Range   Chlamydia trachomatis, NAA Negative Negative   Neisseria Gonorrhoeae by PCR Negative Negative  Treponemal Antibodies, TPPA   Collection Time: 2023/01/19 11:05 AM  Result Value Ref Range   Treponemal Antibodies, TPPA Non Reactive Non Reactive   Interpretation: Comment   HIV Antibody (routine testing w rflx)   Collection Time: 01/19/23 11:05 AM  Result Value Ref Range   HIV Screen 4th Generation wRfx Non Reactive Non Reactive  RPR w/reflex to TrepSure   Collection Time: 01/19/23 11:05 AM  Result Value Ref Range   RPR Non Reactive Non Reactive  Acute Viral Hepatitis (HAV, HBV, HCV)   Collection Time: 19-Jan-2023 11:05 AM  Result Value Ref Range   Hep A IgM Negative Negative   Hepatitis B Surface Ag Negative Negative   Hep B C IgM Negative Negative   HCV Ab Non Reactive Non Reactive  Interpretation:   Collection Time: 01-19-23 11:05 AM  Result Value Ref Range   HCV Interp 1: Comment   Urine Culture   Collection Time: 01/19/2023 12:11 PM   Specimen: Urine   UR  Result Value Ref Range   Urine Culture, Routine Final report    Organism ID, Bacteria No growth   HSV 1 and 2 Ab, IgG   Collection Time: 01-19-2023 12:12 PM  Result Value Ref Range   HSV 1 Glycoprotein G Ab, IgG <0.91 0.00 - 0.90 index   HSV 2 IgG, Type Spec <0.91 0.00 - 0.90 index      Assessment & Plan:   Problem List Items Addressed This Visit       Cardiovascular and Mediastinum   Primary hypertension - Primary   Better on recheck. Not interested  in medicine. Continue to monitor. Call with any concerns.       Relevant Medications   sildenafil (VIAGRA) 100 MG tablet   Other Relevant Orders   Basic metabolic panel     Follow up plan: Return in about 6 months (around 10/06/2023).

## 2023-04-07 NOTE — Assessment & Plan Note (Signed)
Better on recheck. Not interested in medicine. Continue to monitor. Call with any concerns.

## 2023-04-08 LAB — BASIC METABOLIC PANEL
BUN/Creatinine Ratio: 13 (ref 9–20)
BUN: 15 mg/dL (ref 6–24)
CO2: 23 mmol/L (ref 20–29)
Calcium: 8.3 mg/dL — ABNORMAL LOW (ref 8.7–10.2)
Chloride: 106 mmol/L (ref 96–106)
Creatinine, Ser: 1.19 mg/dL (ref 0.76–1.27)
Glucose: 112 mg/dL — ABNORMAL HIGH (ref 70–99)
Potassium: 4.1 mmol/L (ref 3.5–5.2)
Sodium: 143 mmol/L (ref 134–144)
eGFR: 75 mL/min/{1.73_m2} (ref >59)

## 2023-04-23 ENCOUNTER — Other Ambulatory Visit: Payer: Self-pay | Admitting: Family Medicine

## 2023-04-23 NOTE — Telephone Encounter (Signed)
 Requested Prescriptions  Pending Prescriptions Disp Refills   albuterol  (VENTOLIN  HFA) 108 (90 Base) MCG/ACT inhaler [Pharmacy Med Name: ALBUTEROL  HFA (PROAIR ) INHALER] 8.5 each 2    Sig: TAKE 2 PUFFS BY MOUTH EVERY 6 HOURS AS NEEDED FOR WHEEZE OR SHORTNESS OF BREATH     Pulmonology:  Beta Agonists 2 Passed - 04/23/2023  3:37 PM      Passed - Last BP in normal range    BP Readings from Last 1 Encounters:  04/07/23 120/76         Passed - Last Heart Rate in normal range    Pulse Readings from Last 1 Encounters:  04/07/23 97         Passed - Valid encounter within last 12 months    Recent Outpatient Visits           2 weeks ago Primary hypertension   [place] [facility] [place], Megan P, DO   3 months ago Routine general medical examination at a health care facility   [facility], Megan P, DO   10 months ago Gastroenteritis   [place] [facility] [place], [place], DO   11 months ago Primary hypertension   [place] [facility] [place], [place], DO   1 year ago Trichomoniasis   [place] [facility] [place], [place], DO       Future Appointments             In 5 months [place] Renshaw, Jerilee Montane, DO [facility] [facility]

## 2023-09-11 ENCOUNTER — Other Ambulatory Visit: Payer: Self-pay | Admitting: Family Medicine

## 2023-09-12 NOTE — Telephone Encounter (Signed)
 Last OV within protocol.  Requested Prescriptions  Pending Prescriptions Disp Refills   albuterol  (VENTOLIN  HFA) 108 (90 Base) MCG/ACT inhaler [Pharmacy Med Name: ALBUTEROL  HFA (PROAIR ) INHALER] 8.5 each 0    Sig: TAKE 2 PUFFS BY MOUTH EVERY 6 HOURS AS NEEDED FOR WHEEZE OR SHORTNESS OF BREATH     Pulmonology:  Beta Agonists 2 Failed - 09/12/2023  8:37 AM      Failed - Valid encounter within last 12 months    Recent Outpatient Visits   None     Future Appointments             In 3 weeks Johnson, Megan P, DO [place] [facility]            Passed - Last BP in normal range    BP Readings from Last 1 Encounters:  04/07/23 120/76         Passed - Last Heart Rate in normal range    Pulse Readings from Last 1 Encounters:  04/07/23 97

## 2023-10-06 ENCOUNTER — Ambulatory Visit (INDEPENDENT_AMBULATORY_CARE_PROVIDER_SITE_OTHER): Payer: Self-pay | Admitting: Family Medicine

## 2023-10-06 ENCOUNTER — Encounter: Payer: Self-pay | Admitting: Family Medicine

## 2023-10-06 VITALS — BP 170/92 | HR 88 | Temp 98.5°F | Ht 68.0 in | Wt 174.6 lb

## 2023-10-06 DIAGNOSIS — Z136 Encounter for screening for cardiovascular disorders: Secondary | ICD-10-CM

## 2023-10-06 DIAGNOSIS — Z113 Encounter for screening for infections with a predominantly sexual mode of transmission: Secondary | ICD-10-CM | POA: Diagnosis not present

## 2023-10-06 DIAGNOSIS — I10 Essential (primary) hypertension: Secondary | ICD-10-CM | POA: Diagnosis not present

## 2023-10-06 DIAGNOSIS — Z125 Encounter for screening for malignant neoplasm of prostate: Secondary | ICD-10-CM

## 2023-10-06 LAB — URINALYSIS, ROUTINE W REFLEX MICROSCOPIC
Bilirubin, UA: NEGATIVE
Glucose, UA: NEGATIVE
Ketones, UA: NEGATIVE
Nitrite, UA: NEGATIVE
Protein,UA: NEGATIVE
Specific Gravity, UA: >1.03 — ABNORMAL HIGH (ref 1.005–1.030)
Urobilinogen, Ur: 0.2 mg/dL (ref 0.2–1.0)
pH, UA: 5.5 (ref 5.0–7.5)

## 2023-10-06 LAB — MICROALBUMIN, URINE WAIVED
Creatinine, Urine Waived: 300 mg/dL (ref 10–300)
Microalb, Ur Waived: 30 mg/L — ABNORMAL HIGH (ref 0–19)
Microalb/Creat Ratio: <30 mg/g (ref <30)

## 2023-10-06 LAB — MICROSCOPIC EXAMINATION: Bacteria, UA: NONE SEEN

## 2023-10-06 MED ORDER — AMLODIPINE BESYLATE 2.5 MG PO TABS: 2.5000 mg | ORAL_TABLET | Freq: Every day | ORAL | 3 refills | Status: DC

## 2023-10-06 NOTE — Assessment & Plan Note (Signed)
 Running high. Will start 2.5mg  amlodipine and recheck in 3 months. Call with any concerns.

## 2023-10-06 NOTE — Progress Notes (Signed)
 BP (!) 170/92   Pulse 88   Temp 98.5 F (36.9 C) (Oral)   Ht 5' 8 (1.727 m)   Wt 174 lb 9.6 oz (79.2 kg)   SpO2 97%   BMI 26.55 kg/m    Subjective:    Patient ID: Stephen Mercado, male    DOB: [birth year], 48 y.o.   MRN: [id number]  HPI: Stephen Mercado is a 48 y.o. male  Chief Complaint  Patient presents with   Hypertension    Would like a full panel blood work include STD   HYPERTENSION  Hypertension status: uncontrolled  Satisfied with current treatment? no Duration of hypertension: chronic BP monitoring frequency:  not checking BP medication side effects:  no Medication compliance: N/A Previous BP meds:none Aspirin: no Recurrent headaches: no Visual changes: no Palpitations: no Dyspnea: no Chest pain: no Lower extremity edema: no Dizzy/lightheaded: no   Relevant past medical, surgical, family and social history reviewed and updated as indicated. Interim medical history since our last visit reviewed. Allergies and medications reviewed and updated.  Review of Systems  Constitutional: Negative.   Respiratory: Negative.    Cardiovascular: Negative.   Musculoskeletal: Negative.   Neurological: Negative.   Psychiatric/Behavioral: Negative.      Per HPI unless specifically indicated above     Objective:    BP (!) 170/92   Pulse 88   Temp 98.5 F (36.9 C) (Oral)   Ht 5' 8 (1.727 m)   Wt 174 lb 9.6 oz (79.2 kg)   SpO2 97%   BMI 26.55 kg/m   Wt Readings from Last 3 Encounters:  10/06/23 174 lb 9.6 oz (79.2 kg)  04/07/23 180 lb 6.4 oz (81.8 kg)  01/06/23 175 lb 3.2 oz (79.5 kg)    Physical Exam Vitals and nursing note reviewed.  Constitutional:      General: He is not in acute distress.    Appearance: Normal appearance. He is not ill-appearing, toxic-appearing or diaphoretic.  HENT:     Head: Normocephalic and atraumatic.     Right Ear: External ear normal.     Left Ear: External ear normal.     Nose: Nose normal.     Mouth/Throat:     Mouth:  Mucous membranes are moist.     Pharynx: Oropharynx is clear.   Eyes:     General: No scleral icterus.       Right eye: No discharge.        Left eye: No discharge.     Extraocular Movements: Extraocular movements intact.     Conjunctiva/sclera: Conjunctivae normal.     Pupils: Pupils are equal, round, and reactive to light.    Cardiovascular:     Rate and Rhythm: Normal rate and regular rhythm.     Pulses: Normal pulses.     Heart sounds: Normal heart sounds. No murmur heard.    No friction rub. No gallop.  Pulmonary:     Effort: Pulmonary effort is normal. No respiratory distress.     Breath sounds: Normal breath sounds. No stridor. No wheezing, rhonchi or rales.  Chest:     Chest wall: No tenderness.   Musculoskeletal:        General: Normal range of motion.     Cervical back: Normal range of motion and neck supple.   Skin:    General: Skin is warm and dry.     Capillary Refill: Capillary refill takes less than 2 seconds.     Coloration: Skin is not jaundiced  or pale.     Findings: No bruising, erythema, lesion or rash.   Neurological:     General: No focal deficit present.     Mental Status: He is alert and oriented to person, place, and time. Mental status is at baseline.   Psychiatric:        Mood and Affect: Mood normal.        Behavior: Behavior normal.        Thought Content: Thought content normal.        Judgment: Judgment normal.     Results for orders placed or performed in visit on 04/07/23  Basic metabolic panel   Collection Time: 04/07/23 10:02 AM  Result Value Ref Range   Glucose 112 (H) 70 - 99 mg/dL   BUN 15 6 - 24 mg/dL   Creatinine, Ser 8.80 0.76 - 1.27 mg/dL   eGFR 75 >40 fO/fpw/8.26   BUN/Creatinine Ratio 13 9 - 20   Sodium 143 134 - 144 mmol/L   Potassium 4.1 3.5 - 5.2 mmol/L   Chloride 106 96 - 106 mmol/L   CO2 23 20 - 29 mmol/L   Calcium 8.3 (L) 8.7 - 10.2 mg/dL      Assessment & Plan:   Problem List Items Addressed This Visit        Cardiovascular and Mediastinum   Primary hypertension - Primary   Running high. Will start 2.5mg  amlodipine and recheck in 3 months. Call with any concerns.       Relevant Medications   amLODipine (NORVASC) 2.5 MG tablet   Other Relevant Orders   Comprehensive metabolic panel with GFR   CBC with Differential/Platelet   Lipid Panel w/o Chol/HDL Ratio   TSH   Microalbumin, Urine Waived   Other Visit Diagnoses       Screening examination for STI       Labs drawn today at patient's request. Call with any concerns.   Relevant Orders   Urinalysis, Routine w reflex microscopic   GC/Chlamydia Probe Amp   HIV Antibody (routine testing w rflx)   HSV 1 and 2 Ab, IgG   RPR w/reflex to TrepSure   Acute Viral Hepatitis (HAV, HBV, HCV)     Screening for cardiovascular condition       Labs drawn today. Await results. Treat as needed.   Relevant Orders   Lipid Panel w/o Chol/HDL Ratio     Screening for prostate cancer       Labs drawn today. Await results. Treat as needed.   Relevant Orders   PSA        Follow up plan: Return in about 3 months (around 01/06/2024) for physical.

## 2023-10-07 LAB — RPR W/REFLEX TO TREPSURE

## 2023-10-08 LAB — COMPREHENSIVE METABOLIC PANEL WITH GFR
ALT: 38 IU/L (ref 0–44)
AST: 18 IU/L (ref 0–40)
Albumin: 4.4 g/dL (ref 4.1–5.1)
Alkaline Phosphatase: 67 IU/L (ref 44–121)
BUN/Creatinine Ratio: 16 (ref 9–20)
BUN: 18 mg/dL (ref 6–24)
Bilirubin Total: 0.3 mg/dL (ref 0.0–1.2)
CO2: 23 mmol/L (ref 20–29)
Calcium: 9.3 mg/dL (ref 8.7–10.2)
Chloride: 106 mmol/L (ref 96–106)
Creatinine, Ser: 1.13 mg/dL (ref 0.76–1.27)
Globulin, Total: 3.3 g/dL (ref 1.5–4.5)
Glucose: 87 mg/dL (ref 70–99)
Potassium: 4.2 mmol/L (ref 3.5–5.2)
Sodium: 143 mmol/L (ref 134–144)
Total Protein: 7.7 g/dL (ref 6.0–8.5)
eGFR: 80 mL/min/{1.73_m2} (ref >59)

## 2023-10-08 LAB — CBC WITH DIFFERENTIAL/PLATELET
Basophils Absolute: 0.1 10*3/uL (ref 0.0–0.2)
Basos: 1 %
EOS (ABSOLUTE): 0.2 10*3/uL (ref 0.0–0.4)
Eos: 5 %
Hematocrit: 50.7 % (ref 37.5–51.0)
Hemoglobin: 16.5 g/dL (ref 13.0–17.7)
Immature Grans (Abs): 0 10*3/uL (ref 0.0–0.1)
Immature Granulocytes: 0 %
Lymphocytes Absolute: 1.8 10*3/uL (ref 0.7–3.1)
Lymphs: 39 %
MCH: 28.4 pg (ref 26.6–33.0)
MCHC: 32.5 g/dL (ref 31.5–35.7)
MCV: 87 fL (ref 79–97)
Monocytes Absolute: 0.5 10*3/uL (ref 0.1–0.9)
Monocytes: 11 %
Neutrophils Absolute: 2 10*3/uL (ref 1.4–7.0)
Neutrophils: 44 %
Platelets: 194 10*3/uL (ref 150–450)
RBC: 5.82 x10E6/uL — ABNORMAL HIGH (ref 4.14–5.80)
RDW: 12.8 % (ref 11.6–15.4)
WBC: 4.6 10*3/uL (ref 3.4–10.8)

## 2023-10-08 LAB — ACUTE VIRAL HEPATITIS (HAV, HBV, HCV)
HCV Ab: NONREACTIVE
Hep A IgM: NEGATIVE
Hep B C IgM: NEGATIVE
Hepatitis B Surface Ag: NEGATIVE

## 2023-10-08 LAB — LIPID PANEL W/O CHOL/HDL RATIO
Cholesterol, Total: 163 mg/dL (ref 100–199)
HDL: 52 mg/dL (ref >39)
LDL Chol Calc (NIH): 96 mg/dL (ref 0–99)
Triglycerides: 79 mg/dL (ref 0–149)
VLDL Cholesterol Cal: 15 mg/dL (ref 5–40)

## 2023-10-08 LAB — TSH: TSH: 2.32 u[IU]/mL (ref 0.450–4.500)

## 2023-10-08 LAB — HSV 1 AND 2 AB, IGG
HSV 1 Glycoprotein G Ab, IgG: NONREACTIVE
HSV 2 IgG, Type Spec: NONREACTIVE

## 2023-10-08 LAB — PSA: Prostate Specific Ag, Serum: 3.7 ng/mL (ref 0.0–4.0)

## 2023-10-08 LAB — HCV INTERPRETATION

## 2023-10-08 LAB — HIV ANTIBODY (ROUTINE TESTING W REFLEX): HIV Screen 4th Generation wRfx: NONREACTIVE

## 2023-10-08 LAB — GC/CHLAMYDIA PROBE AMP
Chlamydia trachomatis, NAA: NEGATIVE
Neisseria Gonorrhoeae by PCR: NEGATIVE

## 2023-10-08 LAB — SPECIMEN STATUS REPORT

## 2023-10-08 LAB — RPR W/REFLEX TO TREPSURE: RPR: NONREACTIVE

## 2023-10-08 LAB — TREPONEMAL ANTIBODIES, TPPA: Treponemal Antibodies, TPPA: NONREACTIVE

## 2023-10-09 ENCOUNTER — Ambulatory Visit: Payer: Self-pay | Admitting: Family Medicine

## 2023-10-13 ENCOUNTER — Other Ambulatory Visit: Payer: Self-pay | Admitting: Family Medicine

## 2023-10-14 NOTE — Telephone Encounter (Signed)
 Requested Prescriptions  Pending Prescriptions Disp Refills   albuterol  (VENTOLIN  HFA) 108 (90 Base) MCG/ACT inhaler [Pharmacy Med Name: ALBUTEROL  HFA (PROAIR ) INHALER] 8.5 each 2    Sig: TAKE 2 PUFFS BY MOUTH EVERY 6 HOURS AS NEEDED FOR WHEEZE OR SHORTNESS OF BREATH     Pulmonology:  Beta Agonists 2 Failed - 10/14/2023  3:45 PM      Failed - Last BP in normal range    BP Readings from Last 1 Encounters:  10/06/23 (!) 170/92         Passed - Last Heart Rate in normal range    Pulse Readings from Last 1 Encounters:  10/06/23 88         Passed - Valid encounter within last 12 months    Recent Outpatient Visits           1 week ago Primary hypertension   [place] [facility] [place], Duwaine SQUIBB, DO       Future Appointments             In 3 months Vic[place], Duwaine SQUIBB, DO [place] [facility]

## 2024-01-12 ENCOUNTER — Encounter: Payer: Self-pay | Admitting: Family Medicine

## 2024-01-12 ENCOUNTER — Ambulatory Visit (INDEPENDENT_AMBULATORY_CARE_PROVIDER_SITE_OTHER): Admitting: Family Medicine

## 2024-01-12 VITALS — BP 144/84 | HR 65 | Temp 98.0°F | Ht 68.0 in | Wt 178.4 lb

## 2024-01-12 DIAGNOSIS — Z0000 Encounter for general adult medical examination without abnormal findings: Secondary | ICD-10-CM

## 2024-01-12 DIAGNOSIS — I10 Essential (primary) hypertension: Secondary | ICD-10-CM

## 2024-01-12 LAB — MICROALBUMIN, URINE WAIVED
Creatinine, Urine Waived: 200 mg/dL (ref 10–300)
Microalb, Ur Waived: 30 mg/L — ABNORMAL HIGH (ref 0–19)
Microalb/Creat Ratio: <30 mg/g (ref <30)

## 2024-01-12 NOTE — Assessment & Plan Note (Signed)
 Refusing blood pressure medicine. Will continue working on diet and exercise. Follow up 3 months.

## 2024-01-12 NOTE — Progress Notes (Signed)
 BP (!) 144/84   Pulse 65   Temp 98 F (36.7 C) (Oral)   Ht 5' 8 (1.727 m)   Wt 178 lb 6.4 oz (80.9 kg)   SpO2 97%   BMI 27.13 kg/m    Subjective:    Patient ID: Stephen Mercado, male    DOB: Feb 01, 1976, 48 y.o.   MRN: [id number]  HPI: Stephen Mercado is a 48 y.o. male presenting on 01/12/2024 for comprehensive medical examination. Current medical complaints include:  HYPERTENSION- did not start his blood pressure medicine but has been really working out trying  Hypertension status: better  Satisfied with current treatment? yes Duration of hypertension: chronic BP monitoring frequency:  rarely BP medication side effects:  no Medication compliance: poor compliance Previous BP meds:none Aspirin: no Recurrent headaches: no Visual changes: no Palpitations: no Dyspnea: no Chest pain: no Lower extremity edema: no Dizzy/lightheaded: no  Interim Problems from his last visit: no  Depression Screen done today and results listed below:     01/12/2024    8:42 AM 10/06/2023    9:58 AM 04/07/2023    9:52 AM 01/06/2023   10:43 AM 05/20/2022    3:38 PM  Depression screen PHQ 2/9  Decreased Interest 0 0 0 0 0  Down, Depressed, Hopeless 0 0 0 0 0  PHQ - 2 Score 0 0 0 0 0  Altered sleeping  0 0 0 0  Tired, decreased energy  0 0 0 0  Change in appetite  0 0 0 0  Feeling bad or failure about yourself   0 0 0 0  Trouble concentrating  0 0 0 0  Moving slowly or fidgety/restless  0 0 0 0  Suicidal thoughts  0 0 0 0  PHQ-9 Score  0 0 0 0  Difficult doing work/chores   Not difficult at all Not difficult at all Not difficult at all    Past Medical History:  Past Medical History:  Diagnosis Date   Asthma     Surgical History:  Past Surgical History:  Procedure Laterality Date   COLONOSCOPY WITH PROPOFOL  N/A 02/05/2022   Procedure: COLONOSCOPY WITH PROPOFOL ;  Surgeon: Therisa Bi, MD;  Location: [facility] ENDOSCOPY;  Service: Gastroenterology;  Laterality: N/A;   ELBOW SURGERY Left     Screws    Medications:  Current Outpatient Medications on File Prior to Visit  Medication Sig   albuterol  (VENTOLIN  HFA) 108 (90 Base) MCG/ACT inhaler TAKE 2 PUFFS BY MOUTH EVERY 6 HOURS AS NEEDED FOR WHEEZE OR SHORTNESS OF BREATH   sildenafil  (VIAGRA ) 100 MG tablet Take 0.5-1 tablets (50-100 mg total) by mouth daily as needed for erectile dysfunction.   No current facility-administered medications on file prior to visit.    Allergies:  No Known Allergies  Social History:  Social History   Socioeconomic History   Marital status: Divorced    Spouse name: Not on file   Number of children: Not on file   Years of education: Not on file   Highest education level: Not on file  Occupational History   Not on file  Tobacco Use   Smoking status: Never   Smokeless tobacco: Never  Vaping Use   Vaping status: Never Used  Substance and Sexual Activity   Alcohol use: Yes    Comment: On occasion,none last 24hrs   Drug use: No   Sexual activity: Yes  Other Topics Concern   Not on file  Social History Narrative   Not on file  Social Drivers of Corporate investment banker Strain: Low Risk  (01/12/2024)   Overall Financial Resource Strain (CARDIA)    Difficulty of Paying Living Expenses: Not hard at all  Food Insecurity: No Food Insecurity (01/12/2024)   Hunger Vital Sign    Worried About Running Out of Food in the Last Year: Never true    Ran Out of Food in the Last Year: Never true  Transportation Needs: No Transportation Needs (01/12/2024)   PRAPARE - Administrator, Civil Service (Medical): No    Lack of Transportation (Non-Medical): No  Physical Activity: Sufficiently Active (01/12/2024)   Exercise Vital Sign    Days of Exercise per Week: 4 days    Minutes of Exercise per Session: 60 min  Stress: No Stress Concern Present (01/12/2024)   [employer] of Occupational Health - Occupational Stress Questionnaire    Feeling of Stress: Not at all  Social  Connections: Moderately Isolated (01/12/2024)   Social Connection and Isolation Panel    Frequency of Communication with Friends and Family: More than three times a week    Frequency of Social Gatherings with Friends and Family: Once a week    Attends Religious Services: Never    Database administrator or Organizations: No    Attends Banker Meetings: Never    Marital Status: Living with partner  Intimate Partner Violence: Not At Risk (01/12/2024)   Humiliation, Afraid, Rape, and Kick questionnaire    Fear of Current or Ex-Partner: No    Emotionally Abused: No    Physically Abused: No    Sexually Abused: No   Social History   Tobacco Use  Smoking Status Never  Smokeless Tobacco Never   Social History   Substance and Sexual Activity  Alcohol Use Yes   Comment: On occasion,none last 24hrs    Family History:  Family History  Problem Relation Age of Onset   Anuerysm Mother    Hypertension Maternal Grandfather    Asthma Daughter    Cancer Maternal Aunt     Past medical history, surgical history, medications, allergies, family history and social history reviewed with patient today and changes made to appropriate areas of the chart.   Review of Systems  Constitutional: Negative.   HENT: Negative.    Eyes:  Positive for blurred vision. Negative for double vision, photophobia, pain, discharge and redness.  Respiratory: Negative.    Cardiovascular: Negative.   Gastrointestinal: Negative.   Genitourinary: Negative.   Musculoskeletal: Negative.   Skin: Negative.   Neurological: Negative.   Endo/Heme/Allergies: Negative.   Psychiatric/Behavioral: Negative.     All other ROS negative except what is listed above and in the HPI.      Objective:    BP (!) 144/84   Pulse 65   Temp 98 F (36.7 C) (Oral)   Ht 5' 8 (1.727 m)   Wt 178 lb 6.4 oz (80.9 kg)   SpO2 97%   BMI 27.13 kg/m   Wt Readings from Last 3 Encounters:  01/12/24 178 lb 6.4 oz (80.9 kg)   10/06/23 174 lb 9.6 oz (79.2 kg)  04/07/23 180 lb 6.4 oz (81.8 kg)    Physical Exam Vitals and nursing note reviewed.  Constitutional:      General: He is not in acute distress.    Appearance: Normal appearance. He is not ill-appearing, toxic-appearing or diaphoretic.  HENT:     Head: Normocephalic and atraumatic.     Right Ear: Tympanic membrane,  ear canal and external ear normal. There is no impacted cerumen.     Left Ear: Tympanic membrane, ear canal and external ear normal. There is no impacted cerumen.     Nose: Nose normal. No congestion or rhinorrhea.     Mouth/Throat:     Mouth: Mucous membranes are moist.     Pharynx: Oropharynx is clear. No oropharyngeal exudate or posterior oropharyngeal erythema.  Eyes:     General: No scleral icterus.       Right eye: No discharge.        Left eye: No discharge.     Extraocular Movements: Extraocular movements intact.     Conjunctiva/sclera: Conjunctivae normal.     Pupils: Pupils are equal, round, and reactive to light.  Neck:     Vascular: No carotid bruit.  Cardiovascular:     Rate and Rhythm: Normal rate and regular rhythm.     Pulses: Normal pulses.     Heart sounds: No murmur heard.    No friction rub. No gallop.  Pulmonary:     Effort: Pulmonary effort is normal. No respiratory distress.     Breath sounds: Normal breath sounds. No stridor. No wheezing, rhonchi or rales.  Chest:     Chest wall: No tenderness.  Abdominal:     General: Abdomen is flat. Bowel sounds are normal. There is no distension.     Palpations: Abdomen is soft. There is no mass.     Tenderness: There is no abdominal tenderness. There is no right CVA tenderness, left CVA tenderness, guarding or rebound.     Hernia: No hernia is present.  Genitourinary:    Comments: Genital exam deferred with shared decision making Musculoskeletal:        General: No swelling, tenderness, deformity or signs of injury.     Cervical back: Normal range of motion and neck  supple. No rigidity. No muscular tenderness.     Right lower leg: No edema.     Left lower leg: No edema.  Lymphadenopathy:     Cervical: No cervical adenopathy.  Skin:    General: Skin is warm and dry.     Capillary Refill: Capillary refill takes less than 2 seconds.     Coloration: Skin is not jaundiced or pale.     Findings: No bruising, erythema, lesion or rash.  Neurological:     General: No focal deficit present.     Mental Status: He is alert and oriented to person, place, and time.     Cranial Nerves: No cranial nerve deficit.     Sensory: No sensory deficit.     Motor: No weakness.     Coordination: Coordination normal.     Gait: Gait normal.     Deep Tendon Reflexes: Reflexes normal.  Psychiatric:        Mood and Affect: Mood normal.        Behavior: Behavior normal.        Thought Content: Thought content normal.        Judgment: Judgment normal.     Results for orders placed or performed in visit on 10/06/23  GC/Chlamydia Probe Amp   Collection Time: 10/06/23 10:13 AM   Specimen: Urine   UR  Result Value Ref Range   Chlamydia trachomatis, NAA Negative Negative   Neisseria Gonorrhoeae by PCR Negative Negative  Microscopic Examination   Collection Time: 10/06/23 10:13 AM   Urine  Result Value Ref Range   WBC, UA 0-5 0 -  5 /hpf   RBC, Urine 0-2 0 - 2 /hpf   Epithelial Cells (non renal) 0-10 0 - 10 /hpf   Bacteria, UA None seen None seen/Few  Urinalysis, Routine w reflex microscopic   Collection Time: 10/21/2023 10:13 AM  Result Value Ref Range   Specific Gravity, UA >1.030 (H) 1.005 - 1.030   pH, UA 5.5 5.0 - 7.5   Color, UA Yellow Yellow   Appearance Ur Clear Clear   Leukocytes,UA Trace (A) Negative   Protein,UA Negative Negative/Trace   Glucose, UA Negative Negative   Ketones, UA Negative Negative   RBC, UA Trace (A) Negative   Bilirubin, UA Negative Negative   Urobilinogen, Ur 0.2 0.2 - 1.0 mg/dL   Nitrite, UA Negative Negative   Microscopic  Examination See below:   Microalbumin, Urine Waived   Collection Time: 2023-10-21 10:13 AM  Result Value Ref Range   Microalb, Ur Waived 30 (H) 0 - 19 mg/L   Creatinine, Urine Waived 300 10 - 300 mg/dL   Microalb/Creat Ratio <30 <30 mg/g  Treponemal Antibodies, TPPA   Collection Time: 10/21/2023 10:17 AM  Result Value Ref Range   Treponemal Antibodies, TPPA Non Reactive Non Reactive  Comprehensive metabolic panel with GFR   Collection Time: 21-Oct-2023 10:17 AM  Result Value Ref Range   Glucose 87 70 - 99 mg/dL   BUN 18 6 - 24 mg/dL   Creatinine, Ser 8.86 0.76 - 1.27 mg/dL   eGFR 80 >40 fO/fpw/8.26   BUN/Creatinine Ratio 16 9 - 20   Sodium 143 134 - 144 mmol/L   Potassium 4.2 3.5 - 5.2 mmol/L   Chloride 106 96 - 106 mmol/L   CO2 23 20 - 29 mmol/L   Calcium 9.3 8.7 - 10.2 mg/dL   Total Protein 7.7 6.0 - 8.5 g/dL   Albumin 4.4 4.1 - 5.1 g/dL   Globulin, Total 3.3 1.5 - 4.5 g/dL   Bilirubin Total 0.3 0.0 - 1.2 mg/dL   Alkaline Phosphatase 67 44 - 121 IU/L   AST 18 0 - 40 IU/L   ALT 38 0 - 44 IU/L  CBC with Differential/Platelet   Collection Time: 10/21/2023 10:17 AM  Result Value Ref Range   WBC 4.6 3.4 - 10.8 x10E3/uL   RBC 5.82 (H) 4.14 - 5.80 x10E6/uL   Hemoglobin 16.5 13.0 - 17.7 g/dL   Hematocrit 49.2 62.4 - 51.0 %   MCV 87 79 - 97 fL   MCH 28.4 26.6 - 33.0 pg   MCHC 32.5 31.5 - 35.7 g/dL   RDW 87.1 88.3 - 84.5 %   Platelets 194 150 - 450 x10E3/uL   Neutrophils 44 Not Estab. %   Lymphs 39 Not Estab. %   Monocytes 11 Not Estab. %   Eos 5 Not Estab. %   Basos 1 Not Estab. %   Neutrophils Absolute 2.0 1.4 - 7.0 x10E3/uL   Lymphocytes Absolute 1.8 0.7 - 3.1 x10E3/uL   Monocytes Absolute 0.5 0.1 - 0.9 x10E3/uL   EOS (ABSOLUTE) 0.2 0.0 - 0.4 x10E3/uL   Basophils Absolute 0.1 0.0 - 0.2 x10E3/uL   Immature Granulocytes 0 Not Estab. %   Immature Grans (Abs) 0.0 0.0 - 0.1 x10E3/uL  Lipid Panel w/o Chol/HDL Ratio   Collection Time: 10-21-23 10:17 AM  Result Value Ref Range    Cholesterol, Total 163 100 - 199 mg/dL   Triglycerides 79 0 - 149 mg/dL   HDL 52 >60 mg/dL   VLDL Cholesterol Cal 15 5 -  40 mg/dL   LDL Chol Calc (NIH) 96 0 - 99 mg/dL  PSA   Collection Time: 10/06/23 10:17 AM  Result Value Ref Range   Prostate Specific Ag, Serum 3.7 0.0 - 4.0 ng/mL  TSH   Collection Time: 10/06/23 10:17 AM  Result Value Ref Range   TSH 2.320 0.450 - 4.500 uIU/mL  HIV Antibody (routine testing w rflx)   Collection Time: 10/06/23 10:17 AM  Result Value Ref Range   HIV Screen 4th Generation wRfx Non Reactive Non Reactive  HSV 1 and 2 Ab, IgG   Collection Time: 10/06/23 10:17 AM  Result Value Ref Range   HSV 1 Glycoprotein G Ab, IgG Non Reactive Non Reactive   HSV 2 IgG, Type Spec Non Reactive Non Reactive  RPR w/reflex to TrepSure   Collection Time: 10/06/23 10:17 AM  Result Value Ref Range   RPR Non Reactive Non Reactive  Acute Viral Hepatitis (HAV, HBV, HCV)   Collection Time: 10/06/23 10:17 AM  Result Value Ref Range   Hep A IgM Negative Negative   Hepatitis B Surface Ag Negative Negative   Hep B C IgM Negative Negative   HCV Ab Non Reactive Non Reactive  Interpretation:   Collection Time: 10/06/23 10:17 AM  Result Value Ref Range   HCV Interp 1: Comment   Specimen status report   Collection Time: 10/06/23 10:17 AM  Result Value Ref Range   specimen status report Comment       Assessment & Plan:   Problem List Items Addressed This Visit       Cardiovascular and Mediastinum   Primary hypertension   Refusing blood pressure medicine. Will continue working on diet and exercise. Follow up 3 months.       Relevant Orders   Microalbumin, Urine Waived   Other Visit Diagnoses       Routine general medical examination at a health care facility    -  Primary   Vaccines declined. Screening labs checked today. Colonoscopy up to date. Continue diet and exercise. Call with any concerns.   Relevant Orders   Comprehensive metabolic panel with GFR   CBC  with Differential/Platelet   Lipid Panel w/o Chol/HDL Ratio   PSA   TSH   Hepatitis B surface antibody,quantitative       LABORATORY TESTING:  Health maintenance labs ordered today as discussed above.   The natural history of prostate cancer and ongoing controversy regarding screening and potential treatment outcomes of prostate cancer has been discussed with the patient. The meaning of a false positive PSA and a false negative PSA has been discussed. He indicates understanding of the limitations of this screening test and wishes to proceed with screening PSA testing.   IMMUNIZATIONS:   - Tdap: Tetanus vaccination status reviewed: last tetanus booster within 10 years. - Influenza: Refused - Pneumovax: Refused - Prevnar: Refused - COVID: Refused - HPV: Refused - Shingrix vaccine: Refused  SCREENING: - Colonoscopy: Up to date  Discussed with patient purpose of the colonoscopy is to detect colon cancer at curable precancerous or early stages    PATIENT COUNSELING:    Sexuality: Discussed sexually transmitted diseases, partner selection, use of condoms, avoidance of unintended pregnancy  and contraceptive alternatives.   Advised to avoid cigarette smoking.  I discussed with the patient that most people either abstain from alcohol or drink within safe limits (<=14/week and <=4 drinks/occasion for males, <=7/weeks and <= 3 drinks/occasion for females) and that the risk for alcohol disorders  and other health effects rises proportionally with the number of drinks per week and how often a drinker exceeds daily limits.  Discussed cessation/primary prevention of drug use and availability of treatment for abuse.   Diet: Encouraged to adjust caloric intake to maintain  or achieve ideal body weight, to reduce intake of dietary saturated fat and total fat, to limit sodium intake by avoiding high sodium foods and not adding table salt, and to maintain adequate dietary potassium and calcium  preferably from fresh fruits, vegetables, and low-fat dairy products.    stressed the importance of regular exercise  Injury prevention: Discussed safety belts, safety helmets, smoke detector, smoking near bedding or upholstery.   Dental health: Discussed importance of regular tooth brushing, flossing, and dental visits.   Follow up plan: NEXT PREVENTATIVE PHYSICAL DUE IN 1 YEAR. Return in about 3 months (around 04/13/2024).

## 2024-01-14 ENCOUNTER — Ambulatory Visit: Payer: Self-pay | Admitting: Family Medicine

## 2024-01-14 LAB — COMPREHENSIVE METABOLIC PANEL WITH GFR
ALT: 34 IU/L (ref 0–44)
AST: 15 IU/L (ref 0–40)
Albumin: 4.4 g/dL (ref 4.1–5.1)
Alkaline Phosphatase: 63 IU/L (ref 47–123)
BUN/Creatinine Ratio: 12 (ref 9–20)
BUN: 14 mg/dL (ref 6–24)
Bilirubin Total: 0.5 mg/dL (ref 0.0–1.2)
CO2: 23 mmol/L (ref 20–29)
Calcium: 9.3 mg/dL (ref 8.7–10.2)
Chloride: 105 mmol/L (ref 96–106)
Creatinine, Ser: 1.13 mg/dL (ref 0.76–1.27)
Globulin, Total: 2.8 g/dL (ref 1.5–4.5)
Glucose: 110 mg/dL — ABNORMAL HIGH (ref 70–99)
Potassium: 4.4 mmol/L (ref 3.5–5.2)
Sodium: 142 mmol/L (ref 134–144)
Total Protein: 7.2 g/dL (ref 6.0–8.5)
eGFR: 79 mL/min/1.73 (ref >59)

## 2024-01-14 LAB — CBC WITH DIFFERENTIAL/PLATELET
Basophils Absolute: 0 x10E3/uL (ref 0.0–0.2)
Basos: 1 %
EOS (ABSOLUTE): 0.2 x10E3/uL (ref 0.0–0.4)
Eos: 5 %
Hematocrit: 50 % (ref 37.5–51.0)
Hemoglobin: 16.4 g/dL (ref 13.0–17.7)
Immature Grans (Abs): 0 x10E3/uL (ref 0.0–0.1)
Immature Granulocytes: 0 %
Lymphocytes Absolute: 1.8 x10E3/uL (ref 0.7–3.1)
Lymphs: 39 %
MCH: 28.6 pg (ref 26.6–33.0)
MCHC: 32.8 g/dL (ref 31.5–35.7)
MCV: 87 fL (ref 79–97)
Monocytes Absolute: 0.4 x10E3/uL (ref 0.1–0.9)
Monocytes: 10 %
Neutrophils Absolute: 2 x10E3/uL (ref 1.4–7.0)
Neutrophils: 45 %
Platelets: 189 x10E3/uL (ref 150–450)
RBC: 5.73 x10E6/uL (ref 4.14–5.80)
RDW: 12.9 % (ref 11.6–15.4)
WBC: 4.5 x10E3/uL (ref 3.4–10.8)

## 2024-01-14 LAB — LIPID PANEL W/O CHOL/HDL RATIO
Cholesterol, Total: 154 mg/dL (ref 100–199)
HDL: 48 mg/dL (ref >39)
LDL Chol Calc (NIH): 88 mg/dL (ref 0–99)
Triglycerides: 97 mg/dL (ref 0–149)
VLDL Cholesterol Cal: 18 mg/dL (ref 5–40)

## 2024-01-14 LAB — HEPATITIS B SURFACE ANTIBODY, QUANTITATIVE: Hepatitis B Surf Ab Quant: <3.5 m[IU]/mL — ABNORMAL LOW

## 2024-01-14 LAB — TSH: TSH: 1.37 u[IU]/mL (ref 0.450–4.500)

## 2024-01-14 LAB — PSA: Prostate Specific Ag, Serum: 3.5 ng/mL (ref 0.0–4.0)

## 2024-02-12 ENCOUNTER — Other Ambulatory Visit: Payer: Self-pay | Admitting: Family Medicine

## 2024-02-13 NOTE — Telephone Encounter (Signed)
 Requested Prescriptions  Pending Prescriptions Disp Refills   albuterol  (VENTOLIN  HFA) 108 (90 Base) MCG/ACT inhaler [Pharmacy Med Name: ALBUTEROL  HFA (PROAIR ) INHALER] 8.5 each 0    Sig: TAKE 2 PUFFS BY MOUTH EVERY 6 HOURS AS NEEDED FOR WHEEZE OR SHORTNESS OF BREATH     Pulmonology:  [facility] 2 Failed - 02/13/2024 11:48 AM      Failed - Last BP in normal range    BP Readings from Last 1 Encounters:  01/12/24 (!) 144/84         Passed - Last Heart Rate in normal range    Pulse Readings from Last 1 Encounters:  01/12/24 65         Passed - Valid encounter within last 12 months    Recent Outpatient Visits           1 month ago Routine general medical examination at a health care facility   [facility] [place], Megan P, DO   4 months ago Primary hypertension   [facility] [place], [place], DO

## 2024-03-13 ENCOUNTER — Other Ambulatory Visit: Payer: Self-pay | Admitting: Family Medicine

## 2024-04-16 ENCOUNTER — Other Ambulatory Visit: Payer: Self-pay | Admitting: Family Medicine

## 2024-04-16 NOTE — Telephone Encounter (Signed)
 Requested Prescriptions  Pending Prescriptions Disp Refills   albuterol  (VENTOLIN  HFA) 108 (90 Base) MCG/ACT inhaler [Pharmacy Med Name: ALBUTEROL  HFA (PROAIR ) INHALER] 8.5 each 0    Sig: TAKE 2 PUFFS BY MOUTH EVERY 6 HOURS AS NEEDED FOR WHEEZE OR SHORTNESS OF BREATH     Pulmonology:  [facility] 2 Failed - 04/16/2024  1:47 PM      Failed - Last BP in normal range    BP Readings from Last 1 Encounters:  01/12/24 (!) 144/84         Passed - Last Heart Rate in normal range    Pulse Readings from Last 1 Encounters:  01/12/24 65         Passed - Valid encounter within last 12 months    Recent Outpatient Visits           3 months ago Routine general medical examination at a health care facility   [facility] [place], Megan P, DO   6 months ago Primary hypertension   [facility] [place], Darling, DO

## 2024-04-19 ENCOUNTER — Encounter: Payer: Self-pay | Admitting: Family Medicine

## 2024-04-19 ENCOUNTER — Ambulatory Visit (INDEPENDENT_AMBULATORY_CARE_PROVIDER_SITE_OTHER): Admitting: Family Medicine

## 2024-04-19 VITALS — BP 142/82 | HR 83 | Temp 98.0°F | Ht 68.0 in | Wt 179.0 lb

## 2024-04-19 DIAGNOSIS — I10 Essential (primary) hypertension: Secondary | ICD-10-CM | POA: Diagnosis not present

## 2024-04-19 DIAGNOSIS — J4520 Mild intermittent asthma, uncomplicated: Secondary | ICD-10-CM

## 2024-04-19 MED ORDER — BUDESONIDE-FORMOTEROL FUMARATE 80-4.5 MCG/ACT IN AERO: 2.0000 | INHALATION_SPRAY | Freq: Two times a day (BID) | RESPIRATORY_TRACT | 12 refills | Status: AC

## 2024-04-19 NOTE — Progress Notes (Signed)
 "  BP (!) 142/82   Pulse 83   Temp 98 F (36.7 C) (Oral)   Ht 5' 8 (1.727 m)   Wt 179 lb (81.2 kg)   SpO2 95%   BMI 27.22 kg/m    Subjective:    Patient ID: Stephen Mercado, male    DOB: [birth year], 49 y.o.   MRN: [id number]  HPI: Stephen Mercado is a 49 y.o. male  Chief Complaint  Patient presents with   Hypertension   HYPERTENSION- has refused blood pressure medicine  Hypertension status: uncontrolled  Satisfied with current treatment? yes Duration of hypertension: chronic BP monitoring frequency:  not checking BP medication side effects:  N/A Medication compliance: refuses medicine Previous BP meds: none Aspirin: no Recurrent headaches: no Visual changes: no Palpitations: no Dyspnea: no Chest pain: no Lower extremity edema: no Dizzy/lightheaded: no  ASTHMA Asthma status: uncontrolled Satisfied with current treatment?: yes Albuterol /rescue inhaler frequency: every day 4x while at work Dyspnea frequency: never Wheezing frequency: never Cough frequency: never Nocturnal symptom frequency: never  Limitation of activity: no Current upper respiratory symptoms: no Aerochamber/spacer use: no Visits to ER or Urgent Care in past year: no Pneumovax: Not up to Date Influenza: Not up to Date   Relevant past medical, surgical, family and social history reviewed and updated as indicated. Interim medical history since our last visit reviewed. Allergies and medications reviewed and updated.  Review of Systems  Constitutional: Negative.   Respiratory: Negative.    Cardiovascular: Negative.   Gastrointestinal: Negative.   Musculoskeletal: Negative.   Psychiatric/Behavioral: Negative.      Per HPI unless specifically indicated above     Objective:    BP (!) 142/82   Pulse 83   Temp 98 F (36.7 C) (Oral)   Ht 5' 8 (1.727 m)   Wt 179 lb (81.2 kg)   SpO2 95%   BMI 27.22 kg/m   Wt Readings from Last 3 Encounters:  04/19/24 179 lb (81.2 kg)  01/12/24 178 lb 6.4 oz  (80.9 kg)  10/06/23 174 lb 9.6 oz (79.2 kg)    Physical Exam Vitals and nursing note reviewed.  Constitutional:      General: He is not in acute distress.    Appearance: Normal appearance. He is not ill-appearing, toxic-appearing or diaphoretic.  HENT:     Head: Normocephalic and atraumatic.     Right Ear: External ear normal.     Left Ear: External ear normal.     Nose: Nose normal.     Mouth/Throat:     Mouth: Mucous membranes are moist.     Pharynx: Oropharynx is clear.  Eyes:     General: No scleral icterus.       Right eye: No discharge.        Left eye: No discharge.     Extraocular Movements: Extraocular movements intact.     Conjunctiva/sclera: Conjunctivae normal.     Pupils: Pupils are equal, round, and reactive to light.  Cardiovascular:     Rate and Rhythm: Normal rate and regular rhythm.     Pulses: Normal pulses.     Heart sounds: Normal heart sounds. No murmur heard.    No friction rub. No gallop.  Pulmonary:     Effort: Pulmonary effort is normal. No respiratory distress.     Breath sounds: Normal breath sounds. No stridor. No wheezing, rhonchi or rales.  Chest:     Chest wall: No tenderness.  Musculoskeletal:        General:  Normal range of motion.     Cervical back: Normal range of motion and neck supple.  Skin:    General: Skin is warm and dry.     Capillary Refill: Capillary refill takes less than 2 seconds.     Coloration: Skin is not jaundiced or pale.     Findings: No bruising, erythema, lesion or rash.  Neurological:     General: No focal deficit present.     Mental Status: He is alert and oriented to person, place, and time. Mental status is at baseline.  Psychiatric:        Mood and Affect: Mood normal.        Behavior: Behavior normal.        Thought Content: Thought content normal.        Judgment: Judgment normal.     Results for orders placed or performed in visit on 01/12/24  Microalbumin, Urine Waived   Collection Time: 01/12/24   8:55 AM  Result Value Ref Range   Microalb, Ur Waived 30 (H) 0 - 19 mg/L   Creatinine, Urine Waived 200 10 - 300 mg/dL   Microalb/Creat Ratio <30 <30 mg/g  Comprehensive metabolic panel with GFR   Collection Time: 01/12/24  8:56 AM  Result Value Ref Range   Glucose 110 (H) 70 - 99 mg/dL   BUN 14 6 - 24 mg/dL   Creatinine, Ser 8.86 0.76 - 1.27 mg/dL   eGFR 79 >40 fO/fpw/8.26   BUN/Creatinine Ratio 12 9 - 20   Sodium 142 134 - 144 mmol/L   Potassium 4.4 3.5 - 5.2 mmol/L   Chloride 105 96 - 106 mmol/L   CO2 23 20 - 29 mmol/L   Calcium 9.3 8.7 - 10.2 mg/dL   Total Protein 7.2 6.0 - 8.5 g/dL   Albumin 4.4 4.1 - 5.1 g/dL   Globulin, Total 2.8 1.5 - 4.5 g/dL   Bilirubin Total 0.5 0.0 - 1.2 mg/dL   Alkaline Phosphatase 63 47 - 123 IU/L   AST 15 0 - 40 IU/L   ALT 34 0 - 44 IU/L  CBC with Differential/Platelet   Collection Time: 01/12/24  8:56 AM  Result Value Ref Range   WBC 4.5 3.4 - 10.8 x10E3/uL   RBC 5.73 4.14 - 5.80 x10E6/uL   Hemoglobin 16.4 13.0 - 17.7 g/dL   Hematocrit 49.9 62.4 - 51.0 %   MCV 87 79 - 97 fL   MCH 28.6 26.6 - 33.0 pg   MCHC 32.8 31.5 - 35.7 g/dL   RDW 87.0 88.3 - 84.5 %   Platelets 189 150 - 450 x10E3/uL   Neutrophils 45 Not Estab. %   Lymphs 39 Not Estab. %   Monocytes 10 Not Estab. %   Eos 5 Not Estab. %   Basos 1 Not Estab. %   Neutrophils Absolute 2.0 1.4 - 7.0 x10E3/uL   Lymphocytes Absolute 1.8 0.7 - 3.1 x10E3/uL   Monocytes Absolute 0.4 0.1 - 0.9 x10E3/uL   EOS (ABSOLUTE) 0.2 0.0 - 0.4 x10E3/uL   Basophils Absolute 0.0 0.0 - 0.2 x10E3/uL   Immature Granulocytes 0 Not Estab. %   Immature Grans (Abs) 0.0 0.0 - 0.1 x10E3/uL  Lipid Panel w/o Chol/HDL Ratio   Collection Time: 01/12/24  8:56 AM  Result Value Ref Range   Cholesterol, Total 154 100 - 199 mg/dL   Triglycerides 97 0 - 149 mg/dL   HDL 48 >60 mg/dL   VLDL Cholesterol Cal 18 5 - 40 mg/dL  LDL Chol Calc (NIH) 88 0 - 99 mg/dL  PSA   Collection Time: 01/12/24  8:56 AM  Result Value  Ref Range   Prostate Specific Ag, Serum 3.5 0.0 - 4.0 ng/mL  TSH   Collection Time: 01/12/24  8:56 AM  Result Value Ref Range   TSH 1.370 0.450 - 4.500 uIU/mL  Hepatitis B surface antibody,quantitative   Collection Time: 01/12/24  8:56 AM  Result Value Ref Range   Hepatitis B Surf Ab Quant <3.5 (L) Immunity>10 mIU/mL      Assessment & Plan:   Problem List Items Addressed This Visit       Cardiovascular and Mediastinum   Primary hypertension - Primary   Refuses medication. Have discussed risks of uncontrolled hypertension. Will continue to monitor.       Relevant Orders   Basic metabolic panel with GFR     Respiratory   Asthma   Needing his albuterol  4x a day at work- will start symbicort . Call with any concerns.       Relevant Medications   budesonide -formoterol  (SYMBICORT ) 80-4.5 MCG/ACT inhaler     Follow up plan: Return in about 6 months (around 10/17/2024).      "

## 2024-04-19 NOTE — Assessment & Plan Note (Signed)
 Needing his albuterol  4x a day at work- will start symbicort . Call with any concerns.

## 2024-04-19 NOTE — Assessment & Plan Note (Signed)
 Refuses medication. Have discussed risks of uncontrolled hypertension. Will continue to monitor.

## 2024-04-20 ENCOUNTER — Ambulatory Visit: Payer: Self-pay | Admitting: Family Medicine

## 2024-04-20 LAB — BASIC METABOLIC PANEL WITH GFR
BUN/Creatinine Ratio: 15 (ref 9–20)
BUN: 19 mg/dL (ref 6–24)
CO2: 21 mmol/L (ref 20–29)
Calcium: 9.1 mg/dL (ref 8.7–10.2)
Chloride: 107 mmol/L — ABNORMAL HIGH (ref 96–106)
Creatinine, Ser: 1.3 mg/dL — ABNORMAL HIGH (ref 0.76–1.27)
Glucose: 105 mg/dL — ABNORMAL HIGH (ref 70–99)
Potassium: 4.2 mmol/L (ref 3.5–5.2)
Sodium: 144 mmol/L (ref 134–144)
eGFR: 67 mL/min/1.73

## 2024-10-25 ENCOUNTER — Ambulatory Visit: Admitting: Family Medicine
